# Patient Record
Sex: Female | Born: 1980 | Race: Asian | Hispanic: No | Marital: Married | Smoking: Never smoker
Health system: Southern US, Community
[De-identification: ages and names within clinical notes are randomized; demographics above are authoritative.]

## PROBLEM LIST (undated history)

## (undated) DIAGNOSIS — D3911 Neoplasm of uncertain behavior of right ovary: Secondary | ICD-10-CM

## (undated) HISTORY — PX: LASIK: SHX215

## (undated) HISTORY — DX: Neoplasm of uncertain behavior of right ovary: D39.11

---

## 2007-10-22 HISTORY — PX: SALPINGOOPHORECTOMY: SHX82

## 2008-03-10 ENCOUNTER — Other Ambulatory Visit: Admission: RE | Admit: 2008-03-10 | Discharge: 2008-03-10 | Payer: Self-pay | Admitting: Gynecology

## 2008-03-23 ENCOUNTER — Ambulatory Visit (HOSPITAL_COMMUNITY): Admission: RE | Admit: 2008-03-23 | Discharge: 2008-03-23 | Payer: Self-pay | Admitting: Gynecology

## 2008-04-14 ENCOUNTER — Ambulatory Visit (HOSPITAL_COMMUNITY): Admission: RE | Admit: 2008-04-14 | Discharge: 2008-04-15 | Payer: Self-pay | Admitting: Obstetrics and Gynecology

## 2008-04-14 ENCOUNTER — Encounter (INDEPENDENT_AMBULATORY_CARE_PROVIDER_SITE_OTHER): Payer: Self-pay | Admitting: Obstetrics and Gynecology

## 2008-12-08 ENCOUNTER — Ambulatory Visit (HOSPITAL_COMMUNITY): Admission: RE | Admit: 2008-12-08 | Discharge: 2008-12-08 | Payer: Self-pay | Admitting: Gynecology

## 2009-01-24 ENCOUNTER — Ambulatory Visit (HOSPITAL_COMMUNITY): Admission: RE | Admit: 2009-01-24 | Discharge: 2009-01-24 | Payer: Self-pay | Admitting: Obstetrics and Gynecology

## 2009-01-24 ENCOUNTER — Encounter (INDEPENDENT_AMBULATORY_CARE_PROVIDER_SITE_OTHER): Payer: Self-pay | Admitting: Obstetrics and Gynecology

## 2011-01-30 LAB — URINALYSIS, ROUTINE W REFLEX MICROSCOPIC
Glucose, UA: NEGATIVE mg/dL
Ketones, ur: NEGATIVE mg/dL
Leukocytes, UA: NEGATIVE
Protein, ur: NEGATIVE mg/dL
Urobilinogen, UA: 0.2 mg/dL (ref 0.0–1.0)

## 2011-01-30 LAB — PREGNANCY, URINE: Preg Test, Ur: NEGATIVE

## 2011-01-30 LAB — CBC
Hemoglobin: 12.7 g/dL (ref 12.0–15.0)
MCHC: 32.2 g/dL (ref 30.0–36.0)
Platelets: 256 10*3/uL (ref 150–400)
RDW: 13 % (ref 11.5–15.5)

## 2011-03-05 NOTE — Op Note (Signed)
NAME:  Breanna Jenkins, Breanna Jenkins NO.:  [id number]   MEDICAL RECORD NO.:  [id number]          PATIENT TYPE:  AMB   LOCATION:  SDC                           FACILITY:  [facility]   PHYSICIAN:  Miguel Aschoff, M.D.       DATE OF BIRTH:  December 12, 1980   DATE OF PROCEDURE:  01/24/2009  DATE OF DISCHARGE:                               OPERATIVE REPORT   PREOPERATIVE DIAGNOSIS:  Endometrial polyp.   POSTOPERATIVE DIAGNOSIS:  Endometrial polyp.   PROCEDURES:  1. Hysteroscopy.  2. Endometrial polypectomy.   SURGEON:  Miguel Aschoff, M.D.   ANESTHESIA:  General.   COMPLICATIONS:  None.   JUSTIFICATION:  The patient is a 30 year old Asian female followed by  Dr. Teodora Medici who on sonohysterogram was noted to have an  irregularity in the uterine cavity consistent with endometrial polyp.  Because of this finding, she presented now to undergo hysteroscopy and  polypectomy.  The risks and benefits of procedure were discussed with  the patient and her husband.  Informed consent has been obtained.   DESCRIPTION OF PROCEDURE:  The patient was taken to the operating room,  placed in supine position.  General anesthesia was administered without  difficulty.  She then placed in the dorsal lithotomy position, prepped  and draped in usual sterile fashion.  Bladder was catheterized.  Examination under anesthesia revealed normal external genitalia normal  Bartholin, Skene glands, normal urethra.  The vaginal vault was without  gross lesion.  The cervix was without gross lesion.  No adnexal masses  were noted.  At this point, speculum was placed in the vaginal vault.  Anterior cervical lip was grasped with a tenaculum and then the  endocervical canal was dilated using serial Pratt dilators until a #25  Pratt dilator could be passed.  Once this was done, diagnostic  hysteroscope was advanced through the endometrial canal.  No  endocervical lesions were noted.  On entering the endometrial cavity,  however,  there was polyp noted that appeared to be arising from the  fundus.  No other abnormalities were noted within the cavity.  At this  point, the hysteroscope was removed.  Polyp forceps were introduced.  The polyp was grasped and removed without difficulty, it was sent for  histologic study.  The hysteroscope was then were reintroduced to be  certain that the pathology had been eradicated and on examination at  this point, cavity was normal.  No other issues were noted.  At this  point, the procedure was completed.  The hysteroscope was removed.  The  tenaculum was taken off the cervix.  There was excellent hemostasis and  at this point the patient was taken out of lithotomy position, the  anesthetic reversed and brought to recovery room in satisfactory  condition.  Estimated blood loss was minimal.   Plan is for the patient to be discharged home.   MEDICATIONS:  For home include doxycycline one twice a day x3 days,  Darvocet-N 100 one every 4 hours as needed for pain.  She was instructed  to place nothing in the vagina for  10 days.  Call for any problems such  as fever, pain, or heavy bleeding.  She will be seen back in 4 weeks for  followup examination.      Miguel Aschoff, M.D.  Electronically Signed     AR/MEDQ  D:  01/24/2009  T:  01/25/2009  Job:  [id number]   cc:   Leatha Gilding. Mezer, M.D.  Fax: [phone]

## 2011-03-05 NOTE — Op Note (Signed)
NAME:  Breanna Jenkins, Breanna Jenkins NO.:  [id number]   MEDICAL RECORD NO.:  [id number]          PATIENT TYPE:  OIB   LOCATION:  9310                          [facility]ACILITY:  [facility]   PHYSICIAN:  Miguel Aschoff, M.D.       DATE OF BIRTH:  05-24-1981   DATE OF PROCEDURE:  04/14/2008  DATE OF DISCHARGE:                               OPERATIVE REPORT   PREOPERATIVE DIAGNOSIS:  A 12-cm left adnexal mass.   POSTOPERATIVE DIAGNOSIS:  A 12-cm left adnexal mass.   PROCEDURE:  Diagnostic laparoscopy with left salpingo-oophorectomy.   SURGEON:  Miguel Aschoff, MD and Luvenia Redden, MD   ANESTHESIA:  General.   COMPLICATIONS:  None.   JUSTIFICATION:  The patient is a 30 year old [place] female followed by  Dr. Chevis Pretty who was found to have an 11-cm cystic pelvic mass on  evaluation for routine examination.  The mass appeared to be  predominantly cystic.  A CA-125 level had been obtained and was 17.5  which was within normal range.  Because of this large mass with it being  probable neoplasm, she was offered surgery to correct this problem via  laparoscopy with possible laparotomy.  The risks and benefits of the  procedure were discussed with the patient.  Informed consent was  obtained.   PROCEDURE:  The patient was taken to the operating and placed in supine  position.  General anesthesia was administered without difficulty.  She  was then placed in the dorsal lithotomy position and prepped and draped  in the usual sterile fashion.  A Foley catheter was inserted prior to  this examination under anesthesia revealed normal external genitalia,  normal Bartholin Skene's glands, normal urethra.  The vaginal vault was  without gross lesion.  Cervix was without gross lesion.  Uterus noted to  be anterior appeared to be normal size, but above the uterus filling the  pelvis and extending midway up to the umbilicus with a large mobile mass  approximately 12 cm in size.  After the examination was carried  out,  attention was directed to the umbilicus where a small infraumbilical  incision was made.  A Veress needle was inserted and the abdomen was  insufflated with 3 liters CO2.  Following the insufflation, the trocar  to laparoscope was placed followed by laparoscope itself.  On  visualization, the cystic mass was obvious.  The external surface was  completely smooth.  No external excrescences were noted.  There was no  abnormal fluid noted in the abdominal cavity or pelvis and no palpable  lesions were noted on any pelvic structure or intestinal structure.  The  right tube and ovary appeared to be totally within normal limits.  The  uterus was small and anterior to the mass completely involving the left  ovary and it was not possible to discern and any normal ovarian tissue  apart from the cystic mass.  This had a benign appearance.  There were  some question on ultrasound that this represented a dermoid; however, it  was felt that this could be treated laparoscopically.  At this  point, a  5-mm port was established in the midline and the trocar was placed  directly into the cystic mass followed by placement of the Nezhat  suction irrigating unit and the mass was completely depressed using the  Nezhat suction irrigator.  Now that the mass reduced in size it was felt  that this could be treated laparoscopically.  An 11-mm port was then  established in the left lower quadrant.  The cystic ovarian mass was  then elevated.  The infundibulopelvic ligament found as was the ureter  and then with care to avoid any injury to adjacent structures the  infundibulopelvic ligament was cauterized and cut and then the  dissection continued along these ovarian ligament and mesosalpinx until  the uterus was reached.  At this point, the residual structures holding  the tube and ovary in place were cauterized, cut freeing the tube and  ovary.  The ovary was then placed in a EndoCatch bag and brought out   through the 11-mm port with mass now decompressed, tube was completely  contained within the EndoCatch bag and then brought out of the abdomen  in pieces in an effort to save this patient a laparotomy.  This tissue  was all sent for histologic study.  Prior to closing, the pelvis was  irrigated with copious amounts of saline.  Hemostasis appeared to be  excellent.  Once the EndoCatch unit was removed, the fascia and the left  lower quadrant was closed using a figure-of-eight suture of 2-0 Vicryl.  The other incisions were closed using subcuticular 4-0 Vicryl.  The port  sites were then injected with 0.25% Marcaine.  The patient reversed from  the anesthetic, taken to recovery room in satisfactory condition.  The  estimated blood loss was minimal.   PLAN:  The patient to be observed overnight and should be sent home on  April 15, 2008, in stable and satisfactory condition.  Tissue was sent  for histologic study.   FINAL IMPRESSION:  This represented a dermoid, but the final pathology  is pending.      Miguel Aschoff, M.D.  Electronically Signed     AR/MEDQ  D:  04/14/2008  T:  04/15/2008  Job:  [id number]   cc:   Leatha Gilding. Mezer, M.D.  Fax: [phone]

## 2011-03-08 NOTE — Discharge Summary (Signed)
NAME:  Breanna Jenkins, Breanna Jenkins NO.:  [id number]   MEDICAL RECORD NO.:  [id number]          PATIENT TYPE:  OIB   LOCATION:  9310                          [facility]   PHYSICIAN:  Miguel Aschoff, M.D.       DATE OF BIRTH:  [birth year]   DATE OF ADMISSION:  04/14/2008  DATE OF DISCHARGE:  04/15/2008                               DISCHARGE SUMMARY   ADMISSION DIAGNOSIS:  Large pelvic mass.   FINAL DIAGNOSIS:  Benign cystic teratoma, mucinous cyst adenoma of left  ovary.   BRIEF HISTORY:  The patient is a 30 year old oriental female seen by Dr.  Chevis Pretty and noted at that time to have a large pelvic mass.  On  examination on March 29, 2008, she was noted to have a 11 cm mostly cystic  mass involving what appeared to be left ovary extending up to the  midline between the symphysis pubis and umbilicus.  This mass was  nontender and due to big size and suspicion for being ovarian neoplasm,  the options of treatment were discussed with the patient including  laparoscopy and possible laparotomy.  After informed consent was  obtained and preoperative studies were obtained, the patient was taken  to the operating room on April 14, 2008 where under general anesthesia  laparoscopy was carried out.  At the time of laparoscopy, she was found  to have a large, smooth 11-12 cm mass involving the left ovary.  The  total ovary was involved and normal ovarian tissue could be identified.  This mass was reduced laparoscopically and a left salpingo-oophorectomy  was carried out without difficulty via the laparoscope.  The patient  tolerated the procedure well and was evaluated for overnight  observation.  By the next morning, she was in satisfactory condition and  will able to be discharged home.  Medications for home included Tylox 1-  2 every 3 hours as needed for pain.  She was instructed no heavy  lifting.  She is to call for any problems such as fever, pain or heavy  bleeding, place nothing in  vagina for 2 weeks.  The final pathology  report involving the left tube and ovary revealed ovarian endometriosis,  a benign cystic teratoma, and a mucinous cyst adenoma.  It should be  noted that the patient's right tube and ovary and other pelvic  structures were completely within normal limits and no other abnormal  intra-abdominal findings were found.   PLAN:  The patient will be seen back in 4 weeks for followup  examination.      Miguel Aschoff, M.D.  Electronically Signed     AR/MEDQ  D:  04/21/2008  T:  04/21/2008  Job:  [id number]

## 2011-07-18 LAB — BASIC METABOLIC PANEL
CO2: 27
Chloride: 103
Creatinine, Ser: 0.54
GFR calc Af Amer: >60
Sodium: 136

## 2011-07-18 LAB — CBC
Hemoglobin: 10.6 — ABNORMAL LOW
Hemoglobin: 12.3
MCHC: 33.5
MCV: 81.9
RBC: 3.81 — ABNORMAL LOW
RBC: 4.49

## 2011-07-18 LAB — HEMOGLOBIN AND HEMATOCRIT, BLOOD
HCT: 31.7 — ABNORMAL LOW
Hemoglobin: 10.7 — ABNORMAL LOW

## 2011-08-28 ENCOUNTER — Encounter (HOSPITAL_COMMUNITY): Payer: Self-pay | Admitting: *Deleted

## 2011-08-28 ENCOUNTER — Inpatient Hospital Stay (HOSPITAL_COMMUNITY)
Admission: AD | Admit: 2011-08-28 | Discharge: 2011-09-01 | DRG: 766 | Disposition: A | Discharge status: Home / Self Care | Payer: Managed Care, Other (non HMO) | Source: Ambulatory Visit | Attending: Obstetrics and Gynecology | Admitting: Obstetrics and Gynecology

## 2011-08-28 DIAGNOSIS — O324XX0 Maternal care for high head at term, not applicable or unspecified: Secondary | ICD-10-CM | POA: Diagnosis present

## 2011-08-28 DIAGNOSIS — O480 Post-term pregnancy: Principal | ICD-10-CM | POA: Diagnosis present

## 2011-08-28 NOTE — Progress Notes (Signed)
Dr. Marcelle Overlie called on cell phone.  No answer.  Left voicemail to return call to [facility].

## 2011-08-28 NOTE — Progress Notes (Signed)
Dr. Marcelle Overlie notified of pt presenting for induction. Notified of birthing suites unaware.  Notified of VE, ctx pattern and fetal HR tracing.  MD will put orders in for pt.

## 2011-08-28 NOTE — Progress Notes (Signed)
Called Dr. Marcelle Overlie to request DC efm and diet for pt. While waiting on room.  Left message on voicemail.

## 2011-08-28 NOTE — Progress Notes (Signed)
Pt presents for induction, states she is being induced for postdates. occassional uc's. G1

## 2011-08-28 NOTE — Progress Notes (Signed)
Informed pt of waiting on bed at this time.  Pt denies any needs.

## 2011-08-28 NOTE — Progress Notes (Signed)
efm dc'd at this time for reactive fetal tracing.

## 2011-08-29 ENCOUNTER — Encounter (HOSPITAL_COMMUNITY): Payer: Self-pay | Admitting: Anesthesiology

## 2011-08-29 ENCOUNTER — Inpatient Hospital Stay (HOSPITAL_COMMUNITY): Payer: Managed Care, Other (non HMO) | Admitting: Anesthesiology

## 2011-08-29 ENCOUNTER — Encounter (HOSPITAL_COMMUNITY): Payer: Self-pay | Admitting: *Deleted

## 2011-08-29 ENCOUNTER — Encounter (HOSPITAL_COMMUNITY)
Admission: AD | Disposition: A | Discharge status: Home / Self Care | Payer: Self-pay | Source: Ambulatory Visit | Attending: Obstetrics and Gynecology

## 2011-08-29 LAB — HEPATITIS B SURFACE ANTIGEN: Hepatitis B Surface Ag: NEGATIVE

## 2011-08-29 LAB — ABO/RH: RH Type: POSITIVE

## 2011-08-29 LAB — RUBELLA ANTIBODY, IGM: Rubella: IMMUNE

## 2011-08-29 LAB — CBC
MCH: 26.9 pg (ref 26.0–34.0)
MCHC: 33.3 g/dL (ref 30.0–36.0)
MCV: 80.7 fL (ref 78.0–100.0)
Platelets: 205 10*3/uL (ref 150–400)
RDW: 16.8 % — ABNORMAL HIGH (ref 11.5–15.5)

## 2011-08-29 LAB — RPR: RPR: NONREACTIVE

## 2011-08-29 LAB — STREP B DNA PROBE: GBS: NEGATIVE

## 2011-08-29 SURGERY — Surgical Case
Anesthesia: Regional

## 2011-08-29 MED ORDER — LACTATED RINGERS IV SOLN
INTRAVENOUS | Status: DC
  Administered 2011-08-30: 02:00:00 via INTRAVENOUS

## 2011-08-29 MED ORDER — LACTATED RINGERS IV SOLN: 500.0000 mL | Freq: Once | INTRAVENOUS | Status: DC

## 2011-08-29 MED ORDER — SODIUM BICARBONATE 8.4 % IV SOLN
INTRAVENOUS | Status: AC
  Filled 2011-08-29: qty 50

## 2011-08-29 MED ORDER — TERBUTALINE SULFATE 1 MG/ML IJ SOLN: 0.2500 mg | Freq: Once | INTRAMUSCULAR | Status: DC | PRN

## 2011-08-29 MED ORDER — FENTANYL 2.5 MCG/ML BUPIVACAINE 1/10 % EPIDURAL INFUSION (WH - ANES)
INTRAMUSCULAR | Status: DC | PRN
  Administered 2011-08-29: 12 mL/h via EPIDURAL

## 2011-08-29 MED ORDER — MEASLES, MUMPS & RUBELLA VAC ~~LOC~~ INJ: 0.5000 mL | INJECTION | Freq: Once | SUBCUTANEOUS | Status: DC

## 2011-08-29 MED ORDER — CEFAZOLIN SODIUM 1-5 GM-% IV SOLN
INTRAVENOUS | Status: AC
  Filled 2011-08-29: qty 50

## 2011-08-29 MED ORDER — ONDANSETRON HCL 4 MG/2ML IJ SOLN: 4.0000 mg | Freq: Four times a day (QID) | INTRAMUSCULAR | Status: DC | PRN

## 2011-08-29 MED ORDER — LIDOCAINE-EPINEPHRINE (PF) 2 %-1:200000 IJ SOLN
INTRAMUSCULAR | Status: AC
  Filled 2011-08-29: qty 20

## 2011-08-29 MED ORDER — DIPHENHYDRAMINE HCL 50 MG/ML IJ SOLN: 12.5000 mg | INTRAMUSCULAR | Status: DC | PRN

## 2011-08-29 MED ORDER — MEPERIDINE HCL 25 MG/ML IJ SOLN: 6.2500 mg | INTRAMUSCULAR | Status: DC | PRN

## 2011-08-29 MED ORDER — MORPHINE SULFATE (PF) 0.5 MG/ML IJ SOLN
INTRAMUSCULAR | Status: DC | PRN
Start: 2011-08-29 — End: 2011-08-29
  Administered 2011-08-29: 3 mg via EPIDURAL

## 2011-08-29 MED ORDER — EPHEDRINE 5 MG/ML INJ
10.0000 mg | INTRAVENOUS | Status: DC | PRN
  Filled 2011-08-29: qty 4

## 2011-08-29 MED ORDER — FENTANYL CITRATE 0.05 MG/ML IJ SOLN: 25.0000 ug | INTRAMUSCULAR | Status: DC | PRN

## 2011-08-29 MED ORDER — CEFAZOLIN SODIUM 1-5 GM-% IV SOLN: 1.0000 g | INTRAVENOUS | Status: DC

## 2011-08-29 MED ORDER — ACETAMINOPHEN 325 MG PO TABS: 650.0000 mg | ORAL_TABLET | ORAL | Status: DC | PRN

## 2011-08-29 MED ORDER — FLEET ENEMA 7-19 GM/118ML RE ENEM: 1.0000 | ENEMA | RECTAL | Status: DC | PRN

## 2011-08-29 MED ORDER — MORPHINE SULFATE 0.5 MG/ML IJ SOLN
INTRAMUSCULAR | Status: AC
  Filled 2011-08-29: qty 10

## 2011-08-29 MED ORDER — ACETAMINOPHEN 10 MG/ML IV SOLN: 1000.0000 mg | Freq: Four times a day (QID) | INTRAVENOUS | Status: AC | PRN

## 2011-08-29 MED ORDER — OXYTOCIN 20 UNITS IN LACTATED RINGERS INFUSION - SIMPLE
INTRAVENOUS | Status: AC
  Administered 2011-08-29: 125 mL/h via INTRAVENOUS
  Filled 2011-08-29: qty 1000

## 2011-08-29 MED ORDER — PHENYLEPHRINE 40 MCG/ML (10ML) SYRINGE FOR IV PUSH (FOR BLOOD PRESSURE SUPPORT): 80.0000 ug | PREFILLED_SYRINGE | INTRAVENOUS | Status: DC | PRN

## 2011-08-29 MED ORDER — DIPHENHYDRAMINE HCL 50 MG/ML IJ SOLN: 25.0000 mg | INTRAMUSCULAR | Status: DC | PRN

## 2011-08-29 MED ORDER — KETOROLAC TROMETHAMINE 60 MG/2ML IM SOLN
INTRAMUSCULAR | Status: AC
  Administered 2011-08-29: 60 mg via INTRAMUSCULAR
  Filled 2011-08-29: qty 2

## 2011-08-29 MED ORDER — METOCLOPRAMIDE HCL 5 MG/ML IJ SOLN: 10.0000 mg | Freq: Three times a day (TID) | INTRAMUSCULAR | Status: DC | PRN

## 2011-08-29 MED ORDER — NALBUPHINE HCL 10 MG/ML IJ SOLN: 5.0000 mg | INTRAMUSCULAR | Status: DC | PRN

## 2011-08-29 MED ORDER — EPHEDRINE 5 MG/ML INJ: 10.0000 mg | INTRAVENOUS | Status: DC | PRN

## 2011-08-29 MED ORDER — ONDANSETRON HCL 4 MG/2ML IJ SOLN
INTRAMUSCULAR | Status: AC
  Filled 2011-08-29: qty 2

## 2011-08-29 MED ORDER — OXYCODONE-ACETAMINOPHEN 5-325 MG PO TABS: 2.0000 | ORAL_TABLET | ORAL | Status: DC | PRN

## 2011-08-29 MED ORDER — ZOLPIDEM TARTRATE 10 MG PO TABS: 10.0000 mg | ORAL_TABLET | Freq: Every evening | ORAL | Status: DC | PRN

## 2011-08-29 MED ORDER — LACTATED RINGERS IV SOLN
INTRAVENOUS | Status: DC
  Administered 2011-08-29: 17:00:00 via INTRAVENOUS

## 2011-08-29 MED ORDER — KETOROLAC TROMETHAMINE 60 MG/2ML IM SOLN
60.0000 mg | Freq: Once | INTRAMUSCULAR | Status: AC | PRN
  Administered 2011-08-29: 60 mg via INTRAMUSCULAR

## 2011-08-29 MED ORDER — PHENYLEPHRINE 40 MCG/ML (10ML) SYRINGE FOR IV PUSH (FOR BLOOD PRESSURE SUPPORT)
80.0000 ug | PREFILLED_SYRINGE | INTRAVENOUS | Status: DC | PRN
  Filled 2011-08-29 (×3): qty 5

## 2011-08-29 MED ORDER — OXYTOCIN 20 UNITS IN LACTATED RINGERS INFUSION - SIMPLE
1.0000 m[IU]/min | INTRAVENOUS | Status: DC
  Administered 2011-08-29: 1 m[IU]/min via INTRAVENOUS
  Filled 2011-08-29: qty 1000

## 2011-08-29 MED ORDER — OXYTOCIN 20 UNITS IN LACTATED RINGERS INFUSION - SIMPLE
125.0000 mL/h | INTRAVENOUS | Status: AC
  Administered 2011-08-29 (×2): 125 mL/h via INTRAVENOUS
  Filled 2011-08-29: qty 1000

## 2011-08-29 MED ORDER — ONDANSETRON HCL 4 MG/2ML IJ SOLN: 4.0000 mg | Freq: Three times a day (TID) | INTRAMUSCULAR | Status: DC | PRN

## 2011-08-29 MED ORDER — SENNOSIDES-DOCUSATE SODIUM 8.6-50 MG PO TABS
2.0000 | ORAL_TABLET | Freq: Every day | ORAL | Status: DC
  Administered 2011-08-30 – 2011-08-31 (×2): 2 via ORAL

## 2011-08-29 MED ORDER — SIMETHICONE 80 MG PO CHEW
80.0000 mg | CHEWABLE_TABLET | Freq: Three times a day (TID) | ORAL | Status: DC
  Administered 2011-08-30 – 2011-09-01 (×7): 80 mg via ORAL

## 2011-08-29 MED ORDER — OXYTOCIN BOLUS FROM INFUSION
500.0000 mL | Freq: Once | INTRAVENOUS | Status: DC
  Filled 2011-08-29: qty 500

## 2011-08-29 MED ORDER — NALOXONE HCL 0.4 MG/ML IJ SOLN: 1.0000 ug/kg/h | INTRAMUSCULAR | Status: DC | PRN

## 2011-08-29 MED ORDER — ONDANSETRON HCL 4 MG/2ML IJ SOLN
4.0000 mg | INTRAMUSCULAR | Status: DC | PRN
  Administered 2011-08-29: 4 mg via INTRAVENOUS
  Filled 2011-08-29: qty 2

## 2011-08-29 MED ORDER — OXYTOCIN 10 UNIT/ML IJ SOLN
INTRAMUSCULAR | Status: AC
  Filled 2011-08-29: qty 2

## 2011-08-29 MED ORDER — SCOPOLAMINE 1 MG/3DAYS TD PT72
1.0000 | MEDICATED_PATCH | Freq: Once | TRANSDERMAL | Status: DC
  Administered 2011-08-29: 1.5 mg via TRANSDERMAL

## 2011-08-29 MED ORDER — LACTATED RINGERS IV SOLN
500.0000 mL | INTRAVENOUS | Status: DC | PRN
  Administered 2011-08-29: 500 mL via INTRAVENOUS

## 2011-08-29 MED ORDER — IBUPROFEN 600 MG PO TABS: 600.0000 mg | ORAL_TABLET | Freq: Four times a day (QID) | ORAL | Status: DC | PRN

## 2011-08-29 MED ORDER — ONDANSETRON HCL 4 MG/2ML IJ SOLN
INTRAMUSCULAR | Status: DC | PRN
  Administered 2011-08-29: 4 mg via INTRAVENOUS

## 2011-08-29 MED ORDER — KETOROLAC TROMETHAMINE 30 MG/ML IJ SOLN: 30.0000 mg | Freq: Four times a day (QID) | INTRAMUSCULAR | Status: DC | PRN

## 2011-08-29 MED ORDER — TETANUS-DIPHTH-ACELL PERTUSSIS 5-2.5-18.5 LF-MCG/0.5 IM SUSP: 0.5000 mL | Freq: Once | INTRAMUSCULAR | Status: DC

## 2011-08-29 MED ORDER — SODIUM BICARBONATE 8.4 % IV SOLN
INTRAVENOUS | Status: DC | PRN
  Administered 2011-08-29: 4 mL via EPIDURAL

## 2011-08-29 MED ORDER — IBUPROFEN 600 MG PO TABS
600.0000 mg | ORAL_TABLET | Freq: Four times a day (QID) | ORAL | Status: DC
  Administered 2011-08-30 – 2011-09-01 (×10): 600 mg via ORAL
  Filled 2011-08-29 (×9): qty 1

## 2011-08-29 MED ORDER — LIDOCAINE HCL (PF) 1 % IJ SOLN: 30.0000 mL | INTRAMUSCULAR | Status: DC | PRN

## 2011-08-29 MED ORDER — OXYCODONE-ACETAMINOPHEN 5-325 MG PO TABS
1.0000 | ORAL_TABLET | ORAL | Status: DC | PRN
  Administered 2011-08-30 – 2011-08-31 (×3): 1 via ORAL
  Filled 2011-08-29 (×3): qty 1

## 2011-08-29 MED ORDER — PRENATAL PLUS 27-1 MG PO TABS
1.0000 | ORAL_TABLET | Freq: Every day | ORAL | Status: DC
  Administered 2011-08-30 – 2011-09-01 (×3): 1 via ORAL
  Filled 2011-08-29 (×2): qty 1

## 2011-08-29 MED ORDER — SODIUM CHLORIDE 0.9 % IJ SOLN: 3.0000 mL | INTRAMUSCULAR | Status: DC | PRN

## 2011-08-29 MED ORDER — SCOPOLAMINE 1 MG/3DAYS TD PT72: 1.0000 | MEDICATED_PATCH | Freq: Once | TRANSDERMAL | Status: DC

## 2011-08-29 MED ORDER — DIPHENHYDRAMINE HCL 25 MG PO CAPS: 25.0000 mg | ORAL_CAPSULE | ORAL | Status: DC | PRN

## 2011-08-29 MED ORDER — OXYTOCIN 20 UNITS IN LACTATED RINGERS INFUSION - SIMPLE
INTRAVENOUS | Status: DC | PRN
  Administered 2011-08-29: 20 [IU] via INTRAVENOUS

## 2011-08-29 MED ORDER — SODIUM CHLORIDE 0.9 % IV SOLN: 1.0000 ug/kg/h | INTRAVENOUS | Status: DC | PRN

## 2011-08-29 MED ORDER — ZOLPIDEM TARTRATE 5 MG PO TABS: 5.0000 mg | ORAL_TABLET | Freq: Every evening | ORAL | Status: DC | PRN

## 2011-08-29 MED ORDER — MEDROXYPROGESTERONE ACETATE 150 MG/ML IM SUSP: 150.0000 mg | INTRAMUSCULAR | Status: DC | PRN

## 2011-08-29 MED ORDER — SIMETHICONE 80 MG PO CHEW: 80.0000 mg | CHEWABLE_TABLET | ORAL | Status: DC | PRN

## 2011-08-29 MED ORDER — DIPHENHYDRAMINE HCL 50 MG/ML IJ SOLN
12.5000 mg | INTRAMUSCULAR | Status: DC | PRN
  Administered 2011-08-30: 12.5 mg via INTRAVENOUS
  Filled 2011-08-29: qty 1

## 2011-08-29 MED ORDER — FENTANYL 2.5 MCG/ML BUPIVACAINE 1/10 % EPIDURAL INFUSION (WH - ANES)
14.0000 mL/h | INTRAMUSCULAR | Status: DC
  Administered 2011-08-29: 14 mL/h via EPIDURAL
  Filled 2011-08-29 (×2): qty 60

## 2011-08-29 MED ORDER — CEFAZOLIN SODIUM 1-5 GM-% IV SOLN
INTRAVENOUS | Status: DC | PRN
  Administered 2011-08-29: 1 g via INTRAVENOUS

## 2011-08-29 MED ORDER — NALOXONE HCL 0.4 MG/ML IJ SOLN: 0.4000 mg | INTRAMUSCULAR | Status: DC | PRN

## 2011-08-29 MED ORDER — KETOROLAC TROMETHAMINE 30 MG/ML IJ SOLN: 30.0000 mg | Freq: Four times a day (QID) | INTRAMUSCULAR | Status: AC | PRN

## 2011-08-29 MED ORDER — WITCH HAZEL-GLYCERIN EX PADS: 1.0000 "application " | MEDICATED_PAD | CUTANEOUS | Status: DC | PRN

## 2011-08-29 MED ORDER — LACTATED RINGERS IV SOLN
INTRAVENOUS | Status: DC
  Administered 2011-08-29 (×2): via INTRAVENOUS

## 2011-08-29 MED ORDER — ONDANSETRON HCL 4 MG PO TABS: 4.0000 mg | ORAL_TABLET | ORAL | Status: DC | PRN

## 2011-08-29 MED ORDER — DIPHENHYDRAMINE HCL 25 MG PO CAPS
25.0000 mg | ORAL_CAPSULE | ORAL | Status: DC | PRN
  Filled 2011-08-29: qty 1

## 2011-08-29 MED ORDER — OXYTOCIN 20 UNITS IN LACTATED RINGERS INFUSION - SIMPLE: 125.0000 mL/h | Freq: Once | INTRAVENOUS | Status: DC

## 2011-08-29 MED ORDER — SCOPOLAMINE 1 MG/3DAYS TD PT72
MEDICATED_PATCH | TRANSDERMAL | Status: AC
  Administered 2011-08-29: 1.5 mg via TRANSDERMAL
  Filled 2011-08-29: qty 1

## 2011-08-29 MED ORDER — BISACODYL 10 MG RE SUPP: 10.0000 mg | Freq: Every day | RECTAL | Status: DC | PRN

## 2011-08-29 MED ORDER — BUPIVACAINE HCL (PF) 0.25 % IJ SOLN: INTRAMUSCULAR | Status: DC | PRN

## 2011-08-29 MED ORDER — LANOLIN HYDROUS EX OINT: 1.0000 "application " | TOPICAL_OINTMENT | CUTANEOUS | Status: DC | PRN

## 2011-08-29 MED ORDER — DIBUCAINE 1 % RE OINT: 1.0000 "application " | TOPICAL_OINTMENT | RECTAL | Status: DC | PRN

## 2011-08-29 MED ORDER — DIPHENHYDRAMINE HCL 25 MG PO CAPS
25.0000 mg | ORAL_CAPSULE | Freq: Four times a day (QID) | ORAL | Status: DC | PRN
  Filled 2011-08-29: qty 1

## 2011-08-29 MED ORDER — CITRIC ACID-SODIUM CITRATE 334-500 MG/5ML PO SOLN
30.0000 mL | ORAL | Status: DC | PRN
  Administered 2011-08-29: 30 mL via ORAL
  Filled 2011-08-29: qty 15

## 2011-08-29 MED ORDER — MENTHOL 3 MG MT LOZG: 1.0000 | LOZENGE | OROMUCOSAL | Status: DC | PRN

## 2011-08-29 MED ORDER — FENTANYL CITRATE 0.05 MG/ML IJ SOLN
INTRAMUSCULAR | Status: AC
  Filled 2011-08-29: qty 2

## 2011-08-29 MED ORDER — IBUPROFEN 600 MG PO TABS
600.0000 mg | ORAL_TABLET | Freq: Four times a day (QID) | ORAL | Status: DC | PRN
  Filled 2011-08-29: qty 1

## 2011-08-29 SURGICAL SUPPLY — 26 items
BARRIER ADHS 3X4 INTERCEED (GAUZE/BANDAGES/DRESSINGS) IMPLANT
CHLORAPREP W/TINT 26ML (MISCELLANEOUS) ×2 IMPLANT
CLOTH BEACON ORANGE TIMEOUT ST (SAFETY) ×2 IMPLANT
CONTAINER PREFILL 10% NBF 15ML (MISCELLANEOUS) IMPLANT
ELECT REM PT RETURN 9FT ADLT (ELECTROSURGICAL) ×2
ELECTRODE REM PT RTRN 9FT ADLT (ELECTROSURGICAL) ×1 IMPLANT
EXTRACTOR VACUUM M CUP 4 TUBE (SUCTIONS) IMPLANT
GLOVE BIO SURGEON STRL SZ 6.5 (GLOVE) ×4 IMPLANT
GOWN PREVENTION PLUS LG XLONG (DISPOSABLE) ×6 IMPLANT
KIT ABG SYR 3ML LUER SLIP (SYRINGE) IMPLANT
NEEDLE HYPO 22GX1.5 SAFETY (NEEDLE) ×2 IMPLANT
NEEDLE HYPO 25X5/8 SAFETYGLIDE (NEEDLE) ×2 IMPLANT
NS IRRIG 1000ML POUR BTL (IV SOLUTION) ×2 IMPLANT
PACK C SECTION WH (CUSTOM PROCEDURE TRAY) ×2 IMPLANT
SLEEVE SCD COMPRESS KNEE MED (MISCELLANEOUS) IMPLANT
STAPLER VISISTAT 35W (STAPLE) IMPLANT
SUT CHROMIC 0 CTX 36 (SUTURE) ×4 IMPLANT
SUT PLAIN 0 NONE (SUTURE) IMPLANT
SUT PLAIN 2 0 XLH (SUTURE) IMPLANT
SUT VIC AB 0 CT1 27 (SUTURE) ×3
SUT VIC AB 0 CT1 27XBRD ANBCTR (SUTURE) ×3 IMPLANT
SUT VIC AB 4-0 KS 27 (SUTURE) IMPLANT
SYR CONTROL 10ML LL (SYRINGE) ×2 IMPLANT
TOWEL OR 17X24 6PK STRL BLUE (TOWEL DISPOSABLE) ×4 IMPLANT
TRAY FOLEY CATH 14FR (SET/KITS/TRAYS/PACK) ×2 IMPLANT
WATER STERILE IRR 1000ML POUR (IV SOLUTION) ×2 IMPLANT

## 2011-08-29 NOTE — Transfer of Care (Signed)
Immediate Anesthesia Transfer of Care Note  Patient: Breanna Jenkins  Procedure(s) Performed:  CESAREAN SECTION  Patient Location: PACU  Anesthesia Type: Epidural  Level of Consciousness: awake  Airway & Oxygen Therapy: Patient Spontanous Breathing  Post-op Assessment: Report given to PACU RN and Post -op Vital signs reviewed and stable  Post vital signs: Reviewed and stable  Complications: No apparent anesthesia complications

## 2011-08-29 NOTE — Op Note (Signed)
NAME:  Breanna Jenkins, Breanna Jenkins                ACCOUNT NO.:  [id number]  MEDICAL RECORD NO.:  [id number]  LOCATION:  9101                          FACILITY:  [facility]  PHYSICIAN:  Shanee Batch L. Providencia Hottenstein, M.D.DATE OF BIRTH:  09/22/1981  DATE OF PROCEDURE:  08/29/2011 DATE OF DISCHARGE:                              OPERATIVE REPORT   PREOPERATIVE DIAGNOSES: 1. Intrauterine pregnancy at term. 2. Failure to progress, arrest of descent.  POSTOPERATIVE DIAGNOSES: 1. Intrauterine pregnancy at term. 2. Failure to progress, arrest of descent.  PROCEDURE:  Primary low transverse cesarean section.  SURGEON:  Liona Wengert L. Helina Hullum, MD  ANESTHESIA:  An epidural.  EBL:  Less than 500.  DRAINS:  Foley.  PATHOLOGY:  None.  COMPLICATIONS:  None.  PROCEDURE IN DETAIL:  The patient was taken to the operating room from room 160.  She was prepped and draped after time-out was performed according to the ACOG guidelines.  A Foley catheter had been inserted while on labor and delivery.  A low transverse incision was made carried down the fascia.  Fascia scored in the midline, extended laterally. Rectus muscles were separated in the midline.  The peritoneum was entered bluntly.  The peritoneal incision was then stretched.  The bladder blade was inserted.  The lower uterine segment was identified and bladder flap was created sharply and then digitally.  The bladder blade was then readjusted.  A low transverse incision was made in the uterus.  The baby was in OP position, was large for gestational age, female infant, Apgars 9 at 1 minute and 9 at 5 minutes and was delivered easily.  The cord was clamped and cut.  The baby was handed to the awaiting neonatal team and taken to the Newborn Nursery.  The baby's weight was 9 pounds 3 ounces.  The placenta was manually removed, noted to be normal intact with a three-vessel cord.  Antibiotics and Pitocin were given.  The uterus was exteriorized.  It was cleared of all  clots and debris.  The uterus was firm.  The uterine incision was closed in 2 layers using 0 chromic in a running, locked stitch.  The uterus was returned to the abdomen.  Irrigation was performed.  Hemostasis was excellent.  The peritoneum was closed using 0 Vicryl.  The rectus muscles were reapproximated using 0 Vicryl.  The fascia closed using 0 Vicryl starting each corner and meeting in the midline.  After irrigation of subcutaneous layer, the skin was closed with staples.  All sponge, lap, and instrument counts were correct x2.  The patient went to recovery room stable condition.     Wing Gfeller L. Vincente Poli, M.D.     Florestine Avers  D:  08/29/2011  T:  08/29/2011  Job:  [id number]

## 2011-08-29 NOTE — Progress Notes (Signed)
Patient pushing 1 1/2 hour No progress Head at -1 station. Recommend Primary LTCS Risks discussed with patient Agree to proceed Have notified the OR

## 2011-08-29 NOTE — Addendum Note (Signed)
Addendum  created 08/29/11 2056 by Velna Hatchet, MD   Modules edited:Orders, PRL Based Order Sets

## 2011-08-29 NOTE — Progress Notes (Signed)
Pt may go to room 160 in .

## 2011-08-29 NOTE — Anesthesia Procedure Notes (Signed)

## 2011-08-29 NOTE — Anesthesia Postprocedure Evaluation (Signed)
  Anesthesia Post-op Note  Patient: Breanna Jenkins  Procedure(s) Performed:  CESAREAN SECTION   Patient is awake, responsive, moving her legs, and has signs of resolution of her numbness. Pain and nausea are reasonably well controlled. Vital signs are stable and clinically acceptable. Oxygen saturation is clinically acceptable. There are no apparent anesthetic complications at this time. Patient is ready for discharge.

## 2011-08-29 NOTE — Anesthesia Preprocedure Evaluation (Signed)

## 2011-08-29 NOTE — Progress Notes (Signed)
FHT's 135 via external doppler.  Pt states + fm.

## 2011-08-29 NOTE — Progress Notes (Signed)
Pt to room 160 at this time.

## 2011-08-29 NOTE — Progress Notes (Signed)
Pt denies needs at this time.  

## 2011-08-29 NOTE — H&P (Signed)
30 year old Gravida 1 Para 0 at 40 weeks and 3 days admitted last night for post dates induction. She has had uncomplicated prenatal care.  See [place] form GBBS is negative  Afebrile Vital signs are stable FHR is reactive Toco Contractions are every 2 minutes General alert and oriented Lung CTAB Car Regular rate and rhythm Leopolds 8 pound 8 pound 4 oz by ultrasound Cervix 90%/tight 4 cm / -1 vertex arom clear fluid  IMPRESSION: Post dates induction  Plan: Continue pitocin Epidural  Follow labor curve

## 2011-08-29 NOTE — Brief Op Note (Signed)
08/28/2011 - 08/29/2011  6:04 PM  PATIENT:  Gaylord Shih  30 y.o. female  PRE-OPERATIVE DIAGNOSIS:  Failure to Progress  POST-OPERATIVE DIAGNOSIS:  Failure to Progress  PROCEDURE:  Procedure(s): Primary Low Transverse CESAREAN SECTION  SURGEON:  Surgeon(s): Jeani Hawking, MD  PHYSICIAN ASSISTANT:   ASSISTANTS: none   ANESTHESIA:   epidural  EBL:  Total I/O In: 800 [I.V.:800] Out: 500 [Blood:500]  BLOOD ADMINISTERED:none  DRAINS: Urinary Catheter (Foley)   LOCAL MEDICATIONS USED:  NONE  SPECIMEN:  No Specimen  DISPOSITION OF SPECIMEN:  N/A  COUNTS:  YES  TOURNIQUET:  * No tourniquets in log *  DICTATION: .Other Dictation: Dictation Number [id number]  PLAN OF CARE: Admit to inpatient   PATIENT DISPOSITION:  PACU - hemodynamically stable.   Delay start of Pharmacological VTE agent (>24hrs) due to surgical blood loss or risk of bleeding:  {YES/NO/NOT APPLICABLE:20182

## 2011-08-30 LAB — CBC
HCT: 24.7 % — ABNORMAL LOW (ref 36.0–46.0)
Hemoglobin: 8.1 g/dL — ABNORMAL LOW (ref 12.0–15.0)
MCH: 27.2 pg (ref 26.0–34.0)
MCV: 82.9 fL (ref 78.0–100.0)
RBC: 2.98 MIL/uL — ABNORMAL LOW (ref 3.87–5.11)

## 2011-08-30 NOTE — Anesthesia Postprocedure Evaluation (Signed)
  Anesthesia Post-op Note  Patient: Breanna Jenkins  Procedure(s) Performed:  CESAREAN SECTION  Patient Location: Mother/Baby  Anesthesia Type: Epidural  Level of Consciousness: awake, alert  and oriented  Airway and Oxygen Therapy: Patient Spontanous Breathing  Post-op Pain: none  Post-op Assessment: Post-op Vital signs reviewed, Patient's Cardiovascular Status Stable, No headache, No backache, No residual numbness and No residual motor weakness  Post-op Vital Signs: Reviewed and stable  Complications: No apparent anesthesia complications

## 2011-08-30 NOTE — Addendum Note (Signed)
Addendum  created 08/30/11 0803 by Madison Hickman   Modules edited:Notes Section

## 2011-08-30 NOTE — Progress Notes (Signed)
Subjective: Postpartum Day 1: Cesarean Delivery Patient reports tolerating PO and + flatus.    Objective: Vital signs in last 24 hours: Temp:  [98.3 F (36.8 C)-99.3 F (37.4 C)] 98.9 F (37.2 C) (11/09 0530) Pulse Rate:  [29-118] 108  (11/09 0530) Resp:  [16-25] 18  (11/09 0132) BP: (87-144)/(39-69) 102/68 mmHg (11/09 0530) SpO2:  [82 %-99 %] 94 % (11/09 0530)  Physical Exam:  General: alert and cooperative Lochia: appropriate Uterine Fundus: firm abd dressing CDI DVT Evaluation: No evidence of DVT seen on physical exam.   Basename 08/30/11 0510 08/29/11 0218  HGB 8.1* 13.5  HCT 24.7* 40.5    Assessment/Plan: Status post Cesarean section. Doing well postoperatively.  Continue current care.  CURTIS,CAROL G 08/30/2011, 7:45 AM

## 2011-08-30 NOTE — Progress Notes (Signed)
Nursing Tech notified RN of B/P 70/34 HR 1059  (Repeat checks in both arms- see v/s in documentation. Patient asymptomatic, bleeding is scant to small.Uterus 1 below.  Patient Breastfeeding in room, and has been up to BR and walking in room. No change in pain. Dr. Rana Snare notified and orders given to push oral fluids, do not restart IV, and repeat CBC in the am of 112/10.Dr. Rana Snare aware of patients drop in hemoglobin.RN asked Dr. Rana Snare to check uterus. Dr. Rana Snare will follow up in am.

## 2011-08-31 LAB — DIFFERENTIAL
Eosinophils Absolute: 0.2 10*3/uL (ref 0.0–0.7)
Eosinophils Relative: 1 % (ref 0–5)
Lymphocytes Relative: 9 % — ABNORMAL LOW (ref 12–46)
Monocytes Absolute: 1.7 10*3/uL — ABNORMAL HIGH (ref 0.1–1.0)
Neutrophils Relative %: 80 % — ABNORMAL HIGH (ref 43–77)
WBC Morphology: INCREASED

## 2011-08-31 LAB — CBC
MCH: 27.4 pg (ref 26.0–34.0)
MCHC: 32.8 g/dL (ref 30.0–36.0)
Platelets: 154 10*3/uL (ref 150–400)
RBC: 3.03 MIL/uL — ABNORMAL LOW (ref 3.87–5.11)

## 2011-08-31 NOTE — Progress Notes (Signed)
Subjective: Postpartum Day 2: Cesarean Delivery Patient reports no problems voiding.    Objective: Vital signs in last 24 hours: Temp:  [98.1 F (36.7 C)-98.5 F (36.9 C)] 98.2 F (36.8 C) (11/10 0447) Pulse Rate:  [92-107] 92  (11/10 0447) Resp:  [17-18] 18  (11/10 0447) BP: (70-104)/(34-67) 86/53 mmHg (11/10 0447) SpO2:  [96 %-99 %] 96 % (11/09 2208)  Physical Exam:  General: alert, cooperative and no distress Lochia: appropriate Uterine Fundus: firm Incision: healing well DVT Evaluation: No evidence of DVT seen on physical exam.   Basename 08/31/11 0557 08/30/11 0510  HGB 8.3* 8.1*  HCT 25.3* 24.7*    Assessment/Plan: Status post Cesarean section. Doing well postoperatively.  Continue current care.  Thiago Ragsdale C 08/31/2011, 8:12 AM

## 2011-09-01 MED ORDER — OXYCODONE-ACETAMINOPHEN 5-500 MG PO CAPS: 1.0000 | ORAL_CAPSULE | ORAL | Status: AC | PRN

## 2011-09-01 NOTE — Progress Notes (Signed)
Subjective: Postpartum Day 3: Cesarean Delivery Patient reports tolerating PO.    Objective: Vital signs in last 24 hours: Temp:  [98.1 F (36.7 Jenkins)-98.6 F (37 Jenkins)] 98.6 F (37 Jenkins) (11/11 0500) Pulse Rate:  [87-97] 87  (11/11 0500) Resp:  [16-18] 18  (11/11 0500) BP: (86-106)/(53-66) 106/66 mmHg (11/11 0500)  Physical Exam:  General: alert, cooperative and no distress Lochia: appropriate Uterine Fundus: firm Incision: healing well DVT Evaluation: No evidence of DVT seen on physical exam.   Basename 08/31/11 0557 08/30/11 0510  HGB 8.3* 8.1*  HCT 25.3* 24.7*    Assessment/Plan: Status post Cesarean section. Doing well postoperatively.  Discharge home with standard precautions and return to clinic in 4-6 weeks Will dc staples and send home with Tylox and Feso4.  Breanna Jenkins 09/01/2011, 7:52 AM

## 2011-09-02 ENCOUNTER — Encounter (HOSPITAL_COMMUNITY): Payer: Self-pay | Admitting: Obstetrics and Gynecology

## 2011-09-02 NOTE — Discharge Summary (Signed)
Obstetric Discharge Summary Reason for Admission: induction of labor Prenatal Procedures: ultrasound Intrapartum Procedures: cesarean: low cervical, transverse Postpartum Procedures: none Complications-Operative and Postpartum: none Hemoglobin  Date Value Range Status  08/31/2011 8.3* 12.0-15.0 (Jenkins/dL) Final     HCT  Date Value Range Status  08/31/2011 25.3* 36.0-46.0 (%) Final    Discharge Diagnoses: Term Pregnancy-delivered  Discharge Information: Date: 09/02/2011 Activity: pelvic rest Diet: routine Medications: PNV, Ibuprofen and Percocet Condition: stable Instructions: refer to practice specific booklet Discharge to: home   Newborn Data: Live born female  Birth Weight: 9 lb 3.3 oz ([id number] Jenkins) APGAR: 9, 9  Home with mother.  Breanna Jenkins 09/02/2011, 8:06 AM

## 2011-09-07 ENCOUNTER — Inpatient Hospital Stay (HOSPITAL_COMMUNITY)
Admission: AD | Admit: 2011-09-07 | Discharge: 2011-09-07 | Disposition: A | Discharge status: Home / Self Care | Payer: Managed Care, Other (non HMO) | Source: Ambulatory Visit | Attending: Obstetrics and Gynecology | Admitting: Obstetrics and Gynecology

## 2011-09-07 ENCOUNTER — Encounter (HOSPITAL_COMMUNITY): Payer: Self-pay

## 2011-09-07 DIAGNOSIS — O909 Complication of the puerperium, unspecified: Secondary | ICD-10-CM | POA: Insufficient documentation

## 2011-09-07 DIAGNOSIS — IMO0002 Reserved for concepts with insufficient information to code with codable children: Secondary | ICD-10-CM

## 2011-09-07 DIAGNOSIS — O900 Disruption of cesarean delivery wound: Secondary | ICD-10-CM

## 2011-09-07 LAB — DIFFERENTIAL
Blasts: 0 %
Lymphocytes Relative: 17 % (ref 12–46)
Lymphs Abs: 2 10*3/uL (ref 0.7–4.0)
Monocytes Absolute: 1.2 10*3/uL — ABNORMAL HIGH (ref 0.1–1.0)
Monocytes Relative: 10 % (ref 3–12)
Neutro Abs: 8.5 10*3/uL — ABNORMAL HIGH (ref 1.7–7.7)
Neutrophils Relative %: 69 % (ref 43–77)
nRBC: 0 /100 WBC

## 2011-09-07 LAB — CBC
Platelets: 353 10*3/uL (ref 150–400)
RBC: 3.76 MIL/uL — ABNORMAL LOW (ref 3.87–5.11)
RDW: 15.5 % (ref 11.5–15.5)
WBC: 11.7 10*3/uL — ABNORMAL HIGH (ref 4.0–10.5)

## 2011-09-07 NOTE — ED Provider Notes (Signed)
History   Breanna Jenkins is a 30 y.o. year old G7P1001 female 9 days S/P LTCS who presents to [facility] reporting drainage from incision. She was seen in the office yesterday for steristrip removal.   CSN: [id number] Arrival date & time: 09/07/2011 12:46 AM   None     Chief Complaint  Patient presents with  . Abdominal Pain    (Consider location/radiation/quality/duration/timing/severity/associated sxs/prior treatment) HPI  Past Medical History  Diagnosis Date  . No pertinent past medical history     Past Surgical History  Procedure Date  . Left oophorectomy 2009  . Cesarean section 08/29/2011    Procedure: CESAREAN SECTION;  Surgeon: Jeani Hawking, MD;  Location: [facility];  Service: Gynecology;  Laterality: N/A;    History reviewed. No pertinent family history.  History  Substance Use Topics  . Smoking status: Never Smoker   . Smokeless tobacco: Not on file  . Alcohol Use: No    OB History    Grav Para Term Preterm Abortions TAB SAB Ect Mult Living   1 1 1       1       Review of Systems  Constitutional: Negative for fever and chills.  Gastrointestinal: Negative for abdominal pain.    Allergies  Review of patient's allergies indicates no known allergies.  Home Medications  No current outpatient prescriptions on file.  BP 106/47  Pulse 81  Temp(Src) 97.8 F (36.6 C) (Oral)  Resp 18  Wt 65.318 kg (144 lb)  SpO2 97%  Breastfeeding? Yes  Physical Exam: General: NAD, A&O x 4 Abd: Soft, NT, draining mod amount of serosanguinous, odorless fluid from left side of incision. Two superficial disruptions of incision noted: #1 3 cm long at left end. #2 1.5 cm long between left end and midline. Both probed to 0.5 cm depth. Trace erythema at incision borders, not suggestive of infection.    ED Course  Procedures (including critical care time)  Lab Results  Component Value Date   WBC 11.7* 09/07/2011   HGB 10.1* 09/07/2011   HCT 31.8* 09/07/2011   MCV 84.6  09/07/2011   PLT 353 09/07/2011    MDM  Assessment: 1. Draining seroma w/ no evidence of infection 2. 9 days Post-op C/S  Plan: 1. D/C home per consult w/ Dr. Marcelle Overlie. 2. Call office Monday to schedule F/U appt 3. Infection precautions  Katrinka Blazing, Donnette Macmullen 09/07/2011 4:59 AM

## 2011-09-07 NOTE — Progress Notes (Signed)
Patient had a c-section on 08/29/11 and was discharge on Sunday. She states that she the steristrips was removed yesterday by dr Marcelle Overlie at the office. The husband states that at 2300, red fluids was coming out of the c-section incision site. It is light red non-odorous drainage from the left side of the incision site. The site is non-tender or warm to touch. Patient states that she took one tablet of percocet 5/500mg  yesterday morning. She denies any chills or fever, she is lactating. 4 4x4 gauze placed at site.

## 2014-08-03 ENCOUNTER — Other Ambulatory Visit: Payer: Self-pay | Admitting: Obstetrics and Gynecology

## 2014-08-22 ENCOUNTER — Encounter (HOSPITAL_COMMUNITY): Payer: Self-pay

## 2014-10-17 ENCOUNTER — Emergency Department (HOSPITAL_COMMUNITY)
Admission: EM | Admit: 2014-10-17 | Discharge: 2014-10-17 | Disposition: A | Discharge status: Home / Self Care | Payer: Managed Care, Other (non HMO) | Source: Home / Self Care | Attending: Emergency Medicine | Admitting: Emergency Medicine

## 2014-10-17 ENCOUNTER — Encounter (HOSPITAL_COMMUNITY): Payer: Self-pay

## 2014-10-17 DIAGNOSIS — J069 Acute upper respiratory infection, unspecified: Secondary | ICD-10-CM

## 2014-10-17 DIAGNOSIS — R0982 Postnasal drip: Secondary | ICD-10-CM

## 2014-10-17 NOTE — Discharge Instructions (Signed)
Upper Respiratory Infection, Adult Saline nasal spray Lots of fluids Tylenol or ibuprofen for discomfort OTC meds for symptoms Antihistamine for drainage An upper respiratory infection (URI) is also sometimes known as the common cold. The upper respiratory tract includes the nose, sinuses, throat, trachea, and bronchi. Bronchi are the airways leading to the lungs. Most people improve within 1 week, but symptoms can last up to 2 weeks. A residual cough may last even longer.  CAUSES Many different viruses can infect the tissues lining the upper respiratory tract. The tissues become irritated and inflamed and often become very moist. Mucus production is also common. A cold is contagious. You can easily spread the virus to others by oral contact. This includes kissing, sharing a glass, coughing, or sneezing. Touching your mouth or nose and then touching a surface, which is then touched by another person, can also spread the virus. SYMPTOMS  Symptoms typically develop 1 to 3 days after you come in contact with a cold virus. Symptoms vary from person to person. They may include:  Runny nose.  Sneezing.  Nasal congestion.  Sinus irritation.  Sore throat.  Loss of voice (laryngitis).  Cough.  Fatigue.  Muscle aches.  Loss of appetite.  Headache.  Low-grade fever. DIAGNOSIS  You might diagnose your own cold based on familiar symptoms, since most people get a cold 2 to 3 times a year. Your caregiver can confirm this based on your exam. Most importantly, your caregiver can check that your symptoms are not due to another disease such as strep throat, sinusitis, pneumonia, asthma, or epiglottitis. Blood tests, throat tests, and X-rays are not necessary to diagnose a common cold, but they may sometimes be helpful in excluding other more serious diseases. Your caregiver will decide if any further tests are required. RISKS AND COMPLICATIONS  You may be at risk for a more severe case of the  common cold if you smoke cigarettes, have chronic heart disease (such as heart failure) or lung disease (such as asthma), or if you have a weakened immune system. The very young and very old are also at risk for more serious infections. Bacterial sinusitis, middle ear infections, and bacterial pneumonia can complicate the common cold. The common cold can worsen asthma and chronic obstructive pulmonary disease (COPD). Sometimes, these complications can require emergency medical care and may be life-threatening. PREVENTION  The best way to protect against getting a cold is to practice good hygiene. Avoid oral or hand contact with people with cold symptoms. Wash your hands often if contact occurs. There is no clear evidence that vitamin C, vitamin E, echinacea, or exercise reduces the chance of developing a cold. However, it is always recommended to get plenty of rest and practice good nutrition. TREATMENT  Treatment is directed at relieving symptoms. There is no cure. Antibiotics are not effective, because the infection is caused by a virus, not by bacteria. Treatment may include:  Increased fluid intake. Sports drinks offer valuable electrolytes, sugars, and fluids.  Breathing heated mist or steam (vaporizer or shower).  Eating chicken soup or other clear broths, and maintaining good nutrition.  Getting plenty of rest.  Using gargles or lozenges for comfort.  Controlling fevers with ibuprofen or acetaminophen as directed by your caregiver.  Increasing usage of your inhaler if you have asthma. Zinc gel and zinc lozenges, taken in the first 24 hours of the common cold, can shorten the duration and lessen the severity of symptoms. Pain medicines may help with fever, muscle aches,  and throat pain. A variety of non-prescription medicines are available to treat congestion and runny nose. Your caregiver can make recommendations and may suggest nasal or lung inhalers for other symptoms.  HOME CARE  INSTRUCTIONS   Only take over-the-counter or prescription medicines for pain, discomfort, or fever as directed by your caregiver.  Use a warm mist humidifier or inhale steam from a shower to increase air moisture. This may keep secretions moist and make it easier to breathe.  Drink enough water and fluids to keep your urine clear or pale yellow.  Rest as needed.  Return to work when your temperature has returned to normal or as your caregiver advises. You may need to stay home longer to avoid infecting others. You can also use a face mask and careful hand washing to prevent spread of the virus. SEEK MEDICAL CARE IF:   After the first few days, you feel you are getting worse rather than better.  You need your caregiver's advice about medicines to control symptoms.  You develop chills, worsening shortness of breath, or brown or red sputum. These may be signs of pneumonia.  You develop yellow or brown nasal discharge or pain in the face, especially when you bend forward. These may be signs of sinusitis.  You develop a fever, swollen neck glands, pain with swallowing, or white areas in the back of your throat. These may be signs of strep throat. SEEK IMMEDIATE MEDICAL CARE IF:   You have a fever.  You develop severe or persistent headache, ear pain, sinus pain, or chest pain.  You develop wheezing, a prolonged cough, cough up blood, or have a change in your usual mucus (if you have chronic lung disease).  You develop sore muscles or a stiff neck. Document Released: 04/02/2001 Document Revised: 12/30/2011 Document Reviewed: 01/12/2014 [facility] Patient Information 2015 [place], [place]. This information is not intended to replace advice given to you by your health care provider. Make sure you discuss any questions you have with your health care provider.

## 2014-10-17 NOTE — ED Notes (Signed)
C/o cough x 1 week, w a ot of congestion. Minimal relief w Mucinex. 35 yr old son , husband have been ill w similar symptoms

## 2014-10-17 NOTE — ED Provider Notes (Signed)
CSN: [id number]     Arrival date & time 10/17/14  0907 History   First MD Initiated Contact with Patient 10/17/14 [phone]     Chief Complaint  Patient presents with  . Cough   (Consider location/radiation/quality/duration/timing/severity/associated sxs/prior Treatment) HPI Comments: Pleasant 33 year old female accompanied by her husband complaining of cough, sore throat, PND, swelling of the eyes, itch years for about 3 days. She has taken Mucinex without relief.   Past Medical History  Diagnosis Date  . No pertinent past medical history    Past Surgical History  Procedure Laterality Date  . Left oophorectomy  2009  . Cesarean section  08/29/2011    Procedure: CESAREAN SECTION;  Surgeon: Cyril Mourning, MD;  Location: [facility];  Service: Gynecology;  Laterality: N/A;   History reviewed. No pertinent family history. History  Substance Use Topics  . Smoking status: Never Smoker   . Smokeless tobacco: Not on file  . Alcohol Use: No   OB History    Gravida Para Term Preterm AB TAB SAB Ectopic Multiple Living   1 1 1       1      Review of Systems  Constitutional: Negative for fever, chills, activity change, appetite change and fatigue.  HENT: Positive for congestion, postnasal drip, rhinorrhea and sore throat. Negative for ear pain and facial swelling.   Eyes: Negative.   Respiratory: Positive for cough. Negative for shortness of breath and wheezing.   Cardiovascular: Negative.   Gastrointestinal: Negative.   Musculoskeletal: Negative for neck pain and neck stiffness.  Skin: Negative for pallor and rash.  Neurological: Negative.     Allergies  Review of patient's allergies indicates no known allergies.  Home Medications   Prior to Admission medications   Medication Sig Start Date End Date Taking? Authorizing Provider  prenatal vitamin w/FE, FA (PRENATAL 1 + 1) 27-1 MG TABS Take 1 tablet by mouth daily.      Historical Provider, MD   BP 96/57 mmHg  Pulse 69  Temp(Src)  98.4 F (36.9 C) (Oral)  Resp 14  SpO2 99% Physical Exam  Constitutional: She is oriented to person, place, and time. She appears well-developed and well-nourished. No distress.  HENT:  Mouth/Throat: No oropharyngeal exudate.  Bilateral TMs are normal Oropharynx with clear PND and cobblestoning   Eyes: Conjunctivae and EOM are normal.  Neck: Normal range of motion. Neck supple.  Cardiovascular: Normal rate, regular rhythm and normal heart sounds.   Pulmonary/Chest: Effort normal and breath sounds normal. No respiratory distress. She has no wheezes. She has no rales.  Musculoskeletal: Normal range of motion. She exhibits no edema.  Lymphadenopathy:    She has no cervical adenopathy.  Neurological: She is alert and oriented to person, place, and time.  Skin: Skin is warm and dry. No rash noted.  Psychiatric: She has a normal mood and affect.  Nursing note and vitals reviewed.   ED Course  Procedures (including critical care time) Labs Review Labs Reviewed - No data to display  Imaging Review No results found.   MDM   1. URI (upper respiratory infection)   2. PND (post-nasal drip)    Saline nasal spray Lots of fluids Tylenol or ibuprofen for discomfort OTC meds for symptoms Antihistamine for drainage Zaditor eye drop prn    Janne Napoleon, NP 10/17/14 0945

## 2014-10-24 ENCOUNTER — Other Ambulatory Visit: Payer: Self-pay | Admitting: Gynecology

## 2014-10-25 LAB — CYTOLOGY - PAP

## 2016-05-20 ENCOUNTER — Ambulatory Visit (INDEPENDENT_AMBULATORY_CARE_PROVIDER_SITE_OTHER): Payer: Managed Care, Other (non HMO) | Admitting: Emergency Medicine

## 2016-05-20 DIAGNOSIS — R21 Rash and other nonspecific skin eruption: Secondary | ICD-10-CM | POA: Diagnosis not present

## 2016-05-20 MED ORDER — TRIAMCINOLONE ACETONIDE 0.1 % EX CREA: 1.0000 "application " | TOPICAL_CREAM | Freq: Two times a day (BID) | CUTANEOUS | 0 refills | Status: DC

## 2016-05-20 NOTE — Assessment & Plan Note (Addendum)
Most likely she has a dermatitis. Doesn't appear to be infected and no prior history of atopic dermatitis.  KOH negative today  - prescribed topical triamcinolone  - encouraged twice daily emollients  - advised to wear gloves at work if dealing with chemicals.  - advised to follow up in 2 weeks if no improvement.

## 2016-05-20 NOTE — Progress Notes (Signed)
[facility] [address] [place] [address] [phone]  Date:  05/20/2016   Name:  Breanna Jenkins   DOB:  05-06-1981   MRN:  [id number]  PCP:  No primary care provider on file.    Chief Complaint: Other (right thumb, and left index x 2 wks ago, itchy)   History of Present Illness:  This is a 35 y.o. female who is presenting with finger pain. She cut her left fifth finger. She tried some neosporin with no improvement. No pain but it is itchy. Denies any fevers, chills or night sweats. She endorses it being itchy.  She also has a cut on her posterior thumb. No allergies and no eczema. She works in [employer] and works with chemicals. This has happened before but she put neosporin on it and it went away. Has not tried any emollients.    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 Also occurring on dorsal aspect of thumb on right hand. No streaking. No discharge. Neurovascularly intact Neuro: no gross deficits.  Psych: alert and oriented   Assessment and Plan:  Rash Most likely she has a dermatitis. Doesn't appear to be infected and no prior history of atopic dermatitis.  KOH negative today  - prescribed topical triamcinolone  - encouraged twice daily emollients  - advised to wear gloves at work if dealing  with chemicals.  - advised to follow up in 2 weeks if no improvement.

## 2016-05-20 NOTE — Patient Instructions (Addendum)
Thank you for coming in,   Please try to wear gloves when you are at work.   Please try to apply Vaseline or Aveeno twice a day.   Please let us know if there is no improvement in symptoms.    Please feel free to call with any questions or concerns at any time, at [phone]. --Dr. Raeford Razor    IF you received an x-ray today, you will receive an invoice from [facility]. Please contact [facility] at [phone] with questions or concerns regarding your invoice.   IF you received labwork today, you will receive an invoice from [employer]. Please contact [facility] at [phone] with questions or concerns regarding your invoice.   Our billing staff will not be able to assist you with questions regarding bills from these companies.  You will be contacted with the lab results as soon as they are available. The fastest way to get your results is to activate your My Chart account. Instructions are located on the last page of this paperwork. If you have not heard from [place] regarding the results in 2 weeks, please contact this office.

## 2018-05-20 ENCOUNTER — Ambulatory Visit
Admission: RE | Admit: 2018-05-20 | Discharge: 2018-05-20 | Disposition: A | Discharge status: Home / Self Care | Payer: Managed Care, Other (non HMO) | Source: Ambulatory Visit | Attending: Internal Medicine | Admitting: Internal Medicine

## 2018-05-20 ENCOUNTER — Other Ambulatory Visit: Payer: Self-pay | Admitting: Internal Medicine

## 2018-05-20 DIAGNOSIS — N200 Calculus of kidney: Secondary | ICD-10-CM

## 2018-06-08 NOTE — Patient Instructions (Addendum)
Your procedure is scheduled on: Wednesday June 17, 2018 at 7:30 am  Enter through the Main Entrance of [facility] at: 6:00 am  Pick up the phone at the desk and dial [phone].  Call this number if you have problems the morning of surgery: [phone].  Remember: Do NOT eat food or drink any liquids after: Midnight on Tuesday August 27  Take these medicines the morning of surgery with a SIP OF WATER: NONE  STOP ALL VITAMINS, SUPPLEMENTS, HERBAL MEDICATIONS NOW  DO NOT SMOKE DAY OF SURGERY  BRUSH YOUR TEETH DAY OF SURGERY  Do NOT wear jewelry (body piercing), metal hair clips/bobby pins, make-up, or nail polish. Do NOT wear lotions, powders, or perfumes.  You may wear deoderant. Do NOT shave for 48 hours prior to surgery. Do NOT bring valuables to the hospital. Contacts, dentures, or bridgework may not be worn into surgery. Leave suitcase in car.  After surgery it may be brought to your room.    For patients admitted to the hospital, checkout time is 11:00 AM the day of discharge.

## 2018-06-09 ENCOUNTER — Inpatient Hospital Stay (HOSPITAL_COMMUNITY): Admission: RE | Admit: 2018-06-09 | Payer: Managed Care, Other (non HMO) | Source: Ambulatory Visit

## 2018-06-10 ENCOUNTER — Other Ambulatory Visit (HOSPITAL_COMMUNITY): Payer: Managed Care, Other (non HMO)

## 2018-06-10 ENCOUNTER — Encounter (HOSPITAL_COMMUNITY): Payer: Self-pay

## 2018-06-10 ENCOUNTER — Other Ambulatory Visit: Payer: Self-pay

## 2018-06-10 ENCOUNTER — Encounter (HOSPITAL_COMMUNITY)
Admission: RE | Admit: 2018-06-10 | Discharge: 2018-06-10 | Disposition: A | Discharge status: Home / Self Care | Payer: Managed Care, Other (non HMO) | Source: Ambulatory Visit | Attending: Obstetrics & Gynecology | Admitting: Obstetrics & Gynecology

## 2018-06-10 DIAGNOSIS — Z01818 Encounter for other preprocedural examination: Secondary | ICD-10-CM | POA: Insufficient documentation

## 2018-06-10 DIAGNOSIS — R1909 Other intra-abdominal and pelvic swelling, mass and lump: Secondary | ICD-10-CM | POA: Diagnosis not present

## 2018-06-10 LAB — COMPREHENSIVE METABOLIC PANEL
ALT: 22 U/L (ref 0–44)
ANION GAP: 9 (ref 5–15)
AST: 21 U/L (ref 15–41)
Albumin: 4.1 g/dL (ref 3.5–5.0)
Alkaline Phosphatase: 38 U/L (ref 38–126)
BILIRUBIN TOTAL: 0.7 mg/dL (ref 0.3–1.2)
BUN: 11 mg/dL (ref 6–20)
CHLORIDE: 102 mmol/L (ref 98–111)
CO2: 25 mmol/L (ref 22–32)
Calcium: 8.7 mg/dL — ABNORMAL LOW (ref 8.9–10.3)
Creatinine, Ser: 0.49 mg/dL (ref 0.44–1.00)
GFR calc Af Amer: >60 mL/min (ref >60)
Glucose, Bld: 93 mg/dL (ref 70–99)
Potassium: 3.9 mmol/L (ref 3.5–5.1)
Sodium: 136 mmol/L (ref 135–145)
TOTAL PROTEIN: 7 g/dL (ref 6.5–8.1)

## 2018-06-10 LAB — CBC
HEMATOCRIT: 35.8 % — AB (ref 36.0–46.0)
Hemoglobin: 11.5 g/dL — ABNORMAL LOW (ref 12.0–15.0)
MCH: 26 pg (ref 26.0–34.0)
MCHC: 32.1 g/dL (ref 30.0–36.0)
MCV: 80.8 fL (ref 78.0–100.0)
PLATELETS: 257 10*3/uL (ref 150–400)
RBC: 4.43 MIL/uL (ref 3.87–5.11)
RDW: 13.8 % (ref 11.5–15.5)
WBC: 8.4 10*3/uL (ref 4.0–10.5)

## 2018-06-10 LAB — TYPE AND SCREEN
ABO/RH(D): B POS
ANTIBODY SCREEN: NEGATIVE

## 2018-06-10 LAB — ABO/RH: ABO/RH(D): B POS

## 2018-06-15 NOTE — H&P (Signed)
Breanna Jenkins is an 37 y.o. female with right adnexal mass presenting for surgical management.  Initially, the patient reported right flank pain with acute onset and had CT which showed normal appendix and no nephrolithiasis but did reveal a 10 cm right complet adnexal mass. Office u/s showed 11 cm complex right adnexal mass with internal echoes suspicious for mucinous cystadenoma; no blood flow within the cyst and blood flow present to the ovary.  CA-125 is 21.  A similar situation occurred in 2009 and 12 cm left adnexal mass was found on ultrasound.  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  Again, in 2015, a 6 cm right ovarian cyst was removed laparoscopically with pathology showing serous cyst.  The patient has completed childbearing and wishes to proceed with definitive management.   Pertinent Gynecological History: Menses: flow is moderate Bleeding: regular Contraception: none DES exposure: unknown Blood transfusions: none Sexually transmitted diseases: no past history Previous GYN Procedures: DNC and LSO, l/s right ovarian cystectomy, C/S  Last mammogram: n/a Date: n/a Last pap: normal Date: 01/2018 OB History: G1, P1001   Menstrual History: Menarche age: n/a Patient's last menstrual period was 05/17/2018 (exact date).    Past Medical History:  Diagnosis Date  . No pertinent past medical history     Past Surgical History:  Procedure Laterality Date  . CESAREAN SECTION  08/29/2011   Procedure: CESAREAN SECTION;  Surgeon: Cyril Mourning, MD;  Location: [facility];  Service: Gynecology;  Laterality: N/A;  . LASIK    . LEFT OOPHORECTOMY  2009    No family history on file.  Social History:  reports that she has never smoked. She has never used smokeless tobacco. She reports that she does not drink alcohol or use drugs.  Allergies: No Known Allergies  No medications prior to admission.     ROS  Last menstrual period 05/17/2018, currently breastfeeding. Physical Exam  Constitutional: She is oriented to person, place, and time. She appears well-developed and well-nourished.  GI: Soft. There is no rebound and no guarding.  Neurological: She is alert and oriented to person, place, and time.  Skin: Skin is warm and dry.  Psychiatric: She has a normal mood and affect. Her behavior is normal.    No results found for this or any previous visit (from the past 24 hour(s)).  No results found.  Assessment/Plan: 37yo with large right adnexal mass -Right salpingo-oophorectomy -Patient has been counseled re: risk of bleeding, infection, scarring, and damage to surrounding structures.  She understands that removal of her only remaining ovary will render her menopausal and therefore unable to naturally conceive children.  She is also informed of the risks of menopause including decline in CV health, bone mineral density loss, and vasomotor symptoms. All questions were answered and the patient wishes to proceed.  Master Touchet, [place] 06/15/2018, 8:39 PM

## 2018-06-16 NOTE — Anesthesia Preprocedure Evaluation (Addendum)
Anesthesia Evaluation  Patient identified by MRN, date of birth, ID band Patient awake    Reviewed: Allergy & Precautions, NPO status , Patient's Chart, lab work & pertinent test results  Airway Mallampati: I  TM Distance: >3 FB Neck ROM: Full    Dental no notable dental hx. (+) Teeth Intact, Dental Advisory Given   Pulmonary    Pulmonary exam normal breath sounds clear to auscultation       Cardiovascular Normal cardiovascular exam Rhythm:Regular Rate:Normal     Neuro/Psych negative neurological ROS  negative psych ROS   GI/Hepatic negative GI ROS, Neg liver ROS,   Endo/Other  negative endocrine ROS  Renal/GU negative Renal ROS  negative genitourinary   Musculoskeletal negative musculoskeletal ROS (+)   Abdominal   Peds  Hematology negative hematology ROS (+)   Anesthesia Other Findings Right adnexal mass  Reproductive/Obstetrics                            Anesthesia Physical Anesthesia Plan  ASA: I  Anesthesia Plan: General   Post-op Pain Management:    Induction: Intravenous  PONV Risk Score and Plan: 3 and Midazolam, Dexamethasone and Ondansetron  Airway Management Planned: Oral ETT  Additional Equipment:   Intra-op Plan:   Post-operative Plan: Extubation in OR  Informed Consent: I have reviewed the patients History and Physical, chart, labs and discussed the procedure including the risks, benefits and alternatives for the proposed anesthesia with the patient or authorized representative who has indicated his/her understanding and acceptance.   Dental advisory given  Plan Discussed with: CRNA  Anesthesia Plan Comments:        Anesthesia Quick Evaluation

## 2018-06-17 ENCOUNTER — Other Ambulatory Visit: Payer: Self-pay

## 2018-06-17 ENCOUNTER — Ambulatory Visit (HOSPITAL_COMMUNITY): Payer: Managed Care, Other (non HMO) | Admitting: Anesthesiology

## 2018-06-17 ENCOUNTER — Observation Stay (HOSPITAL_COMMUNITY)
Admission: RE | Admit: 2018-06-17 | Discharge: 2018-06-18 | Disposition: A | Discharge status: Home / Self Care | Payer: Managed Care, Other (non HMO) | Source: Ambulatory Visit | Attending: Obstetrics & Gynecology | Admitting: Obstetrics & Gynecology

## 2018-06-17 ENCOUNTER — Encounter (HOSPITAL_COMMUNITY): Payer: Self-pay

## 2018-06-17 ENCOUNTER — Encounter (HOSPITAL_COMMUNITY)
Admission: RE | Disposition: A | Discharge status: Home / Self Care | Payer: Self-pay | Source: Ambulatory Visit | Attending: Obstetrics & Gynecology

## 2018-06-17 DIAGNOSIS — Z90721 Acquired absence of ovaries, unilateral: Secondary | ICD-10-CM | POA: Diagnosis not present

## 2018-06-17 DIAGNOSIS — C561 Malignant neoplasm of right ovary: Secondary | ICD-10-CM | POA: Diagnosis not present

## 2018-06-17 DIAGNOSIS — R1900 Intra-abdominal and pelvic swelling, mass and lump, unspecified site: Secondary | ICD-10-CM | POA: Diagnosis present

## 2018-06-17 HISTORY — PX: SALPINGOOPHORECTOMY: SHX82

## 2018-06-17 HISTORY — PX: LAPAROTOMY: SHX154

## 2018-06-17 LAB — PREGNANCY, URINE: PREG TEST UR: NEGATIVE

## 2018-06-17 SURGERY — LAPAROTOMY
Anesthesia: General | Site: Abdomen | Laterality: Right

## 2018-06-17 MED ORDER — HYDROMORPHONE HCL 1 MG/ML IJ SOLN
INTRAMUSCULAR | Status: DC | PRN
  Administered 2018-06-17: 1 mg via INTRAVENOUS

## 2018-06-17 MED ORDER — ONDANSETRON HCL 4 MG/2ML IJ SOLN: 4.0000 mg | Freq: Four times a day (QID) | INTRAMUSCULAR | Status: DC | PRN

## 2018-06-17 MED ORDER — ROCURONIUM BROMIDE 100 MG/10ML IV SOLN
INTRAVENOUS | Status: AC
  Filled 2018-06-17: qty 1

## 2018-06-17 MED ORDER — DEXAMETHASONE SODIUM PHOSPHATE 10 MG/ML IJ SOLN
INTRAMUSCULAR | Status: DC | PRN
  Administered 2018-06-17: 10 mg via INTRAVENOUS

## 2018-06-17 MED ORDER — PROPOFOL 10 MG/ML IV BOLUS
INTRAVENOUS | Status: DC | PRN
  Administered 2018-06-17: 100 mg via INTRAVENOUS

## 2018-06-17 MED ORDER — SIMETHICONE 80 MG PO CHEW: 80.0000 mg | CHEWABLE_TABLET | Freq: Four times a day (QID) | ORAL | Status: DC | PRN

## 2018-06-17 MED ORDER — ONDANSETRON HCL 4 MG PO TABS: 4.0000 mg | ORAL_TABLET | Freq: Four times a day (QID) | ORAL | Status: DC | PRN

## 2018-06-17 MED ORDER — HYDROMORPHONE HCL 1 MG/ML IJ SOLN
0.2500 mg | INTRAMUSCULAR | Status: DC | PRN
  Administered 2018-06-17 (×2): 0.25 mg via INTRAVENOUS

## 2018-06-17 MED ORDER — FENTANYL CITRATE (PF) 100 MCG/2ML IJ SOLN: INTRAMUSCULAR | Status: DC | PRN

## 2018-06-17 MED ORDER — LACTATED RINGERS IV SOLN
INTRAVENOUS | Status: DC
  Administered 2018-06-17 – 2018-06-18 (×3): via INTRAVENOUS

## 2018-06-17 MED ORDER — MENTHOL 3 MG MT LOZG: 1.0000 | LOZENGE | OROMUCOSAL | Status: DC | PRN

## 2018-06-17 MED ORDER — LIDOCAINE HCL (CARDIAC) PF 100 MG/5ML IV SOSY
PREFILLED_SYRINGE | INTRAVENOUS | Status: AC
  Filled 2018-06-17: qty 5

## 2018-06-17 MED ORDER — FENTANYL CITRATE (PF) 100 MCG/2ML IJ SOLN
INTRAMUSCULAR | Status: DC | PRN
  Administered 2018-06-17 (×4): 50 ug via INTRAVENOUS

## 2018-06-17 MED ORDER — HYDROMORPHONE HCL 1 MG/ML IJ SOLN: 0.2000 mg | INTRAMUSCULAR | Status: DC | PRN

## 2018-06-17 MED ORDER — KETOROLAC TROMETHAMINE 30 MG/ML IJ SOLN
30.0000 mg | Freq: Four times a day (QID) | INTRAMUSCULAR | Status: DC
  Administered 2018-06-17 – 2018-06-18 (×4): 30 mg via INTRAVENOUS
  Filled 2018-06-17 (×3): qty 1

## 2018-06-17 MED ORDER — PROPOFOL 10 MG/ML IV BOLUS
INTRAVENOUS | Status: AC
  Filled 2018-06-17: qty 20

## 2018-06-17 MED ORDER — NALOXONE HCL 0.4 MG/ML IJ SOLN
INTRAMUSCULAR | Status: DC | PRN
  Administered 2018-06-17: .08 mg via INTRAVENOUS

## 2018-06-17 MED ORDER — DOCUSATE SODIUM 100 MG PO CAPS
100.0000 mg | ORAL_CAPSULE | Freq: Two times a day (BID) | ORAL | Status: DC
  Administered 2018-06-17: 100 mg via ORAL
  Filled 2018-06-17: qty 1

## 2018-06-17 MED ORDER — MIDAZOLAM HCL 2 MG/2ML IJ SOLN
INTRAMUSCULAR | Status: DC | PRN
  Administered 2018-06-17: 1 mg via INTRAVENOUS

## 2018-06-17 MED ORDER — GLYCOPYRROLATE 0.2 MG/ML IJ SOLN
INTRAMUSCULAR | Status: AC
  Filled 2018-06-17: qty 1

## 2018-06-17 MED ORDER — CEFAZOLIN SODIUM-DEXTROSE 2-4 GM/100ML-% IV SOLN
2.0000 g | INTRAVENOUS | Status: AC
  Administered 2018-06-17: 2 g via INTRAVENOUS

## 2018-06-17 MED ORDER — MIDAZOLAM HCL 2 MG/2ML IJ SOLN
INTRAMUSCULAR | Status: AC
  Filled 2018-06-17: qty 2

## 2018-06-17 MED ORDER — ROCURONIUM BROMIDE 100 MG/10ML IV SOLN
INTRAVENOUS | Status: DC | PRN
  Administered 2018-06-17: 30 mg via INTRAVENOUS

## 2018-06-17 MED ORDER — SUGAMMADEX SODIUM 200 MG/2ML IV SOLN
INTRAVENOUS | Status: DC | PRN
  Administered 2018-06-17: 110 mg via INTRAVENOUS

## 2018-06-17 MED ORDER — SUGAMMADEX SODIUM 200 MG/2ML IV SOLN
INTRAVENOUS | Status: AC
  Filled 2018-06-17: qty 2

## 2018-06-17 MED ORDER — SCOPOLAMINE 1 MG/3DAYS TD PT72
MEDICATED_PATCH | TRANSDERMAL | Status: AC
  Administered 2018-06-17: 1.5 mg via TRANSDERMAL
  Filled 2018-06-17: qty 1

## 2018-06-17 MED ORDER — GLYCOPYRROLATE 0.2 MG/ML IJ SOLN
INTRAMUSCULAR | Status: DC | PRN
  Administered 2018-06-17: 0.1 mg via INTRAVENOUS

## 2018-06-17 MED ORDER — HYDROMORPHONE HCL 1 MG/ML IJ SOLN
INTRAMUSCULAR | Status: AC
  Filled 2018-06-17: qty 1

## 2018-06-17 MED ORDER — LIDOCAINE HCL (CARDIAC) PF 100 MG/5ML IV SOSY
PREFILLED_SYRINGE | INTRAVENOUS | Status: DC | PRN
  Administered 2018-06-17: 100 mg via INTRAVENOUS

## 2018-06-17 MED ORDER — SCOPOLAMINE 1 MG/3DAYS TD PT72
1.0000 | MEDICATED_PATCH | Freq: Once | TRANSDERMAL | Status: DC
  Administered 2018-06-17: 1.5 mg via TRANSDERMAL

## 2018-06-17 MED ORDER — LACTATED RINGERS IV SOLN
INTRAVENOUS | Status: DC
  Administered 2018-06-17: 125 mL/h via INTRAVENOUS

## 2018-06-17 MED ORDER — ONDANSETRON HCL 4 MG/2ML IJ SOLN
INTRAMUSCULAR | Status: AC
  Filled 2018-06-17: qty 2

## 2018-06-17 MED ORDER — FENTANYL CITRATE (PF) 250 MCG/5ML IJ SOLN
INTRAMUSCULAR | Status: AC
  Filled 2018-06-17: qty 5

## 2018-06-17 MED ORDER — DEXAMETHASONE SODIUM PHOSPHATE 10 MG/ML IJ SOLN
INTRAMUSCULAR | Status: AC
  Filled 2018-06-17: qty 1

## 2018-06-17 MED ORDER — CEFAZOLIN SODIUM-DEXTROSE 2-4 GM/100ML-% IV SOLN
INTRAVENOUS | Status: AC
  Filled 2018-06-17: qty 100

## 2018-06-17 MED ORDER — OXYCODONE-ACETAMINOPHEN 5-325 MG PO TABS
1.0000 | ORAL_TABLET | ORAL | Status: DC | PRN
  Administered 2018-06-17 (×2): 1 via ORAL
  Filled 2018-06-17 (×2): qty 1

## 2018-06-17 MED ORDER — KETOROLAC TROMETHAMINE 30 MG/ML IJ SOLN
30.0000 mg | Freq: Four times a day (QID) | INTRAMUSCULAR | Status: DC
  Filled 2018-06-17: qty 1

## 2018-06-17 MED ORDER — ONDANSETRON HCL 4 MG/2ML IJ SOLN
INTRAMUSCULAR | Status: DC | PRN
  Administered 2018-06-17: 4 mg via INTRAVENOUS

## 2018-06-17 MED ORDER — HYDROMORPHONE HCL 1 MG/ML IJ SOLN
INTRAMUSCULAR | Status: AC
  Filled 2018-06-17: qty 0.5

## 2018-06-17 SURGICAL SUPPLY — 29 items
BENZOIN TINCTURE PRP APPL 2/3 (GAUZE/BANDAGES/DRESSINGS) ×3 IMPLANT
CANISTER SUCT 3000ML PPV (MISCELLANEOUS) ×3 IMPLANT
CLOSURE WOUND 1/2 X4 (GAUZE/BANDAGES/DRESSINGS) ×1
CONT PATH 16OZ SNAP LID 3702 (MISCELLANEOUS) ×3 IMPLANT
DRAPE WARM FLUID 44X44 (DRAPE) IMPLANT
DRSG OPSITE POSTOP 4X10 (GAUZE/BANDAGES/DRESSINGS) ×3 IMPLANT
DURAPREP 26ML APPLICATOR (WOUND CARE) ×6 IMPLANT
GAUZE 4X4 16PLY RFD (DISPOSABLE) IMPLANT
GLOVE BIO SURGEON STRL SZ 6 (GLOVE) ×3 IMPLANT
GLOVE BIOGEL PI IND STRL 6 (GLOVE) ×2 IMPLANT
GLOVE BIOGEL PI IND STRL 7.0 (GLOVE) ×1 IMPLANT
GLOVE BIOGEL PI INDICATOR 6 (GLOVE) ×4
GLOVE BIOGEL PI INDICATOR 7.0 (GLOVE) ×2
GOWN STRL REUS W/TWL LRG LVL3 (GOWN DISPOSABLE) ×9 IMPLANT
NS IRRIG 1000ML POUR BTL (IV SOLUTION) ×3 IMPLANT
PACK ABDOMINAL GYN (CUSTOM PROCEDURE TRAY) ×3 IMPLANT
PAD OB MATERNITY 4.3X12.25 (PERSONAL CARE ITEMS) ×3 IMPLANT
PROTECTOR NERVE ULNAR (MISCELLANEOUS) ×6 IMPLANT
SPONGE LAP 18X18 X RAY DECT (DISPOSABLE) ×6 IMPLANT
STRIP CLOSURE SKIN 1/2X4 (GAUZE/BANDAGES/DRESSINGS) ×2 IMPLANT
SUT MON AB-0 CT1 36 (SUTURE) ×3 IMPLANT
SUT PLAIN 2 0 XLH (SUTURE) IMPLANT
SUT VIC AB 0 CT1 18XCR BRD8 (SUTURE) IMPLANT
SUT VIC AB 0 CT1 27 (SUTURE) ×12
SUT VIC AB 0 CT1 27XBRD ANBCTR (SUTURE) ×6 IMPLANT
SUT VIC AB 0 CT1 8-18 (SUTURE)
SUT VIC AB 4-0 KS 27 (SUTURE) ×3 IMPLANT
TOWEL OR 17X24 6PK STRL BLUE (TOWEL DISPOSABLE) ×6 IMPLANT
TRAY FOLEY W/BAG SLVR 14FR (SET/KITS/TRAYS/PACK) ×3 IMPLANT

## 2018-06-17 NOTE — Op Note (Signed)
Breanna Jenkins PROCEDURE DATE: 06/17/2018  PREOPERATIVE DIAGNOSIS:  Right adnexal mass POSTOPERATIVE DIAGNOSIS:  SAA SURGEON:   Dr. Linda Hedges ASSISTANT:   Dr. Arvella Nigh OPERATION:  Laparotomy with right salpingo-oophorectomy, washings and frozen pathology ANESTHESIA:  General endotracheal.  INDICATIONS: The patient is a 37 y.o. G1P1001 with history of abdominal pain and 11 cm right ovarian cyst.  CA-125 wnl. Patient previously underwent LSO 10 years ago and she understands the expectations and risks of surgical menopause.  The patient made a decision to undergo definite surgical treatment. On the preoperative visit, the risks, benefits, indications, and alternatives of the procedure were reviewed with the patient.  On the day of surgery, the risks of surgery were again discussed with the patient including but not limited to: bleeding which may require transfusion or reoperation; infection which may require antibiotics; injury to bowel, bladder, ureters or other surrounding organs; need for additional procedures; thromboembolic phenomenon, incisional problems and other postoperative/anesthesia complications. Written informed consent was obtained.    OPERATIVE FINDINGS: A small uterus with surgically absent left fallopian tube and ovary.  Large right ovarian cyst with palpable densities within it.  Small amount of spillage of clear, mucinous material on removal from abdomen.   ESTIMATED BLOOD LOSS: 75 ml SPECIMENS:  1. Pelvic washings  2. Right fallopian tube and ovary sent for frozen; mucinous cystic neoplasm COMPLICATIONS:  None immediate.   DESCRIPTION OF PROCEDURE: The patient received intravenous antibiotics and had sequential compression devices applied to her lower extremities while in the preoperative area.   She was taken to the operating room and placed under general anesthesia without difficulty and found to be adequate.The abdomen and perineum were prepped and draped in a sterile  manner, and a Foley catheter was inserted into the bladder and attached to constant drainage. After an adequate timeout was performed, a Pfannensteil skin incision was made. This incision was taken down to the fascia using electrocautery with care given to maintain good hemostasis. The fascia was incised in the midline and the fascial incision was then extended bilaterally using electrocautery without difficulty. The fascia was then dissected off the underlying rectus muscles using blunt and sharp dissection. The rectus muscles were split bluntly in the midline and the peritoneum entered sharply without complication. This peritoneal incision was then extended superiorly and inferiorly with care given to prevent bowel or bladder injury. Upon entry into the abdominal cavity, the upper abdomen was inspected and found to be normal. Attention was then turned to the pelvis. Pelvic washings were collected. The large right adnexal mass was elevated out of the pelvis with a small amount of spillage of clear, mucinous material.  No normal ovarian tissue was identifiable.  The IP was double clamped and the fallopian tube and ovary were removed.  This specimen was sent for frozen section.  The pedicle was suture ligated using 0 vicryl suture.  Hemostasis was observed.  The pelvis was irrigated and hemostasis was reconfirmed.  All laparotomy sponges and instruments were removed from the abdomen. The peritoneum was closed with 2-0 monocryl, and the fascia was closed with 0 Vicryl in a running fashion. The skin was closed with a 4-0 vicryl subcuticular stitch. Sponge, lap, needle, and instrument counts were correct times two. The patient was taken to the recovery area awake, extubated and in stable condition.

## 2018-06-17 NOTE — Progress Notes (Signed)
Day of Surgery Procedure(s) (LRB): Exploratory Laparotomy with Right Salpingo ophorectomy (Right)  Subjective: Patient reports incisional pain and tolerating PO.  Catheter in.  Objective: I have reviewed patient's vital signs, intake and output and medications.  General: alert, cooperative and appears stated age Extremities: extremities normal, atraumatic, no cyanosis or edema Abd: soft, ND, dressing c/d  Assessment: s/p Procedure(s) with comments: Exploratory Laparotomy with Right Salpingo ophorectomy (Right) - removal of right adnexal mass with possible BSO: stable, progressing well and tolerating diet  Plan: Advance diet Encourage ambulation  LOS: 0 days    Breanna Jenkins 06/17/2018, 1:34 PM

## 2018-06-17 NOTE — Anesthesia Postprocedure Evaluation (Signed)
Anesthesia Post Note  Patient: Breanna Jenkins  Procedure(s) Performed: Exploratory Laparotomy with Right Salpingo ophorectomy (Right Abdomen)     Patient location during evaluation: PACU Anesthesia Type: General Level of consciousness: awake and alert Pain management: pain level controlled Vital Signs Assessment: post-procedure vital signs reviewed and stable Respiratory status: spontaneous breathing, nonlabored ventilation, respiratory function stable and patient connected to nasal cannula oxygen Cardiovascular status: blood pressure returned to baseline and stable Postop Assessment: no apparent nausea or vomiting Anesthetic complications: no    Last Vitals:  Vitals:   06/17/18 1558 06/17/18 1945  BP: (!) 81/48 (!) 82/50  Pulse: 62 (!) 55  Resp: 18 15  Temp: 36.7 C 36.9 C  SpO2: 98% 100%    Last Pain:  Vitals:   06/17/18 1950  TempSrc:   PainSc: 6                  Gabriella Woodhead L Dailey Buccheri

## 2018-06-17 NOTE — Transfer of Care (Signed)
Immediate Anesthesia Transfer of Care Note  Patient: Breanna Jenkins  Procedure(s) Performed: LAPAROTOMY with frozen section (Bilateral Abdomen)  Patient Location: PACU  Anesthesia Type:General  Level of Consciousness: awake, alert  and oriented  Airway & Oxygen Therapy: Patient Spontanous Breathing and Patient connected to nasal cannula oxygen  Post-op Assessment: Report given to RN and Post -op Vital signs reviewed and stable  Post vital signs: Reviewed and stable  Last Vitals:  Vitals Value Taken Time  BP 97/55 06/17/2018  9:00 AM  Temp    Pulse 65 06/17/2018  9:01 AM  Resp 10 06/17/2018  9:01 AM  SpO2 100 % 06/17/2018  9:01 AM  Vitals shown include unvalidated device data.  Last Pain:  Vitals:   06/17/18 0900  TempSrc:   PainSc: Asleep      Patients Stated Pain Goal: 3 (51/83/43 [id number])  Complications: No apparent anesthesia complications

## 2018-06-17 NOTE — Progress Notes (Signed)
No change to H&P.  Patient prefers to keep ovary if able to dissect out cyst.  She is counseled that I will make every attempt to do that.  She also understands that if I'm unable to spare normal ovarian stroma, she will become surgically menopausal.  She agrees.    Linda Hedges, DO

## 2018-06-17 NOTE — Anesthesia Procedure Notes (Signed)
Procedure Name: Intubation Date/Time: 06/17/2018 7:35 AM Performed by: Flossie Dibble, CRNA Pre-anesthesia Checklist: Patient identified, Patient being monitored, Timeout performed, Emergency Drugs available and Suction available Patient Re-evaluated:Patient Re-evaluated prior to induction Oxygen Delivery Method: Circle System Utilized Preoxygenation: Pre-oxygenation with 100% oxygen Induction Type: IV induction Ventilation: Mask ventilation without difficulty Laryngoscope Size: Mac and 3 Grade View: Grade I Tube type: Oral Tube size: 7.0 mm Number of attempts: 1 Airway Equipment and Method: stylet Placement Confirmation: ETT inserted through vocal cords under direct vision,  positive ETCO2 and breath sounds checked- equal and bilateral Secured at: 21 cm Tube secured with: Tape Dental Injury: Teeth and Oropharynx as per pre-operative assessment

## 2018-06-18 ENCOUNTER — Encounter (HOSPITAL_COMMUNITY): Payer: Self-pay | Admitting: Obstetrics & Gynecology

## 2018-06-18 DIAGNOSIS — C561 Malignant neoplasm of right ovary: Secondary | ICD-10-CM | POA: Diagnosis not present

## 2018-06-18 LAB — COMPREHENSIVE METABOLIC PANEL
ALK PHOS: 28 U/L — AB (ref 38–126)
ALT: 13 U/L (ref 0–44)
ANION GAP: 7 (ref 5–15)
AST: 19 U/L (ref 15–41)
Albumin: 2.9 g/dL — ABNORMAL LOW (ref 3.5–5.0)
BILIRUBIN TOTAL: 0.3 mg/dL (ref 0.3–1.2)
BUN: 10 mg/dL (ref 6–20)
CALCIUM: 8 mg/dL — AB (ref 8.9–10.3)
CO2: 25 mmol/L (ref 22–32)
Chloride: 105 mmol/L (ref 98–111)
Creatinine, Ser: 0.5 mg/dL (ref 0.44–1.00)
GFR calc Af Amer: >60 mL/min (ref >60)
GLUCOSE: 112 mg/dL — AB (ref 70–99)
POTASSIUM: 3.2 mmol/L — AB (ref 3.5–5.1)
Sodium: 137 mmol/L (ref 135–145)
TOTAL PROTEIN: 5.5 g/dL — AB (ref 6.5–8.1)

## 2018-06-18 LAB — CBC
HEMATOCRIT: 27.7 % — AB (ref 36.0–46.0)
HEMOGLOBIN: 9.1 g/dL — AB (ref 12.0–15.0)
MCH: 26.2 pg (ref 26.0–34.0)
MCHC: 32.9 g/dL (ref 30.0–36.0)
MCV: 79.8 fL (ref 78.0–100.0)
Platelets: 183 10*3/uL (ref 150–400)
RBC: 3.47 MIL/uL — ABNORMAL LOW (ref 3.87–5.11)
RDW: 13.7 % (ref 11.5–15.5)
WBC: 9.8 10*3/uL (ref 4.0–10.5)

## 2018-06-18 MED ORDER — DOCUSATE SODIUM 100 MG PO CAPS: 100.0000 mg | ORAL_CAPSULE | Freq: Two times a day (BID) | ORAL | 0 refills | Status: DC

## 2018-06-18 MED ORDER — OXYCODONE-ACETAMINOPHEN 5-325 MG PO TABS: 1.0000 | ORAL_TABLET | ORAL | 0 refills | Status: DC | PRN

## 2018-06-18 MED ORDER — IBUPROFEN 600 MG PO TABS: 600.0000 mg | ORAL_TABLET | Freq: Four times a day (QID) | ORAL | 1 refills | Status: DC | PRN

## 2018-06-18 NOTE — Progress Notes (Signed)
1 Day Post-Op Procedure(s) (LRB): Exploratory Laparotomy with Right Salpingo ophorectomy (Right)  Subjective: Patient reports tolerating PO, + flatus and no problems voiding.  Pain well controlled.  No N/V.  Ambulating well.  Objective: I have reviewed patient's vital signs, intake and output, medications and labs.  General: alert, cooperative and appears stated age Extremities: extremities normal, atraumatic, no cyanosis or edema Abd: soft, ND, dressing c/d  Assessment: s/p Procedure(s) with comments: Exploratory Laparotomy with Right Salpingo ophorectomy (Right) - removal of right adnexal mass with possible BSO: stable, progressing well, tolerating diet and ready for discharge  Plan: Discharge home  LOS: 0 days    Breanna Jenkins 06/18/2018, 9:38 AM

## 2018-06-18 NOTE — Progress Notes (Signed)
Patient discharged home with husband. Medications discussed; Follow-up care reviewed; Admission discussed; Discharge instructions reviewed; Prescriptions reviewed; Pain management discussed. Patient verbalized understanding.

## 2018-06-18 NOTE — Addendum Note (Signed)
Addendum  created 06/18/18 0959 by Georgeanne Nim, CRNA   Sign clinical note

## 2018-06-18 NOTE — Anesthesia Postprocedure Evaluation (Signed)
Anesthesia Post Note  Patient: Breanna Jenkins  Procedure(s) Performed: Exploratory Laparotomy with Right Salpingo ophorectomy (Right Abdomen)     Patient location during evaluation: [facility] Anesthesia Type: General Level of consciousness: awake Pain management: pain level controlled Vital Signs Assessment: post-procedure vital signs reviewed and stable Respiratory status: spontaneous breathing Cardiovascular status: stable Postop Assessment: adequate PO intake and no apparent nausea or vomiting Anesthetic complications: no    Last Vitals:  Vitals:   06/18/18 0407 06/18/18 0741  BP: (!) 89/54 (!) 67/35  Pulse: (!) 54 (!) 50  Resp: 16 18  Temp: 36.9 C 36.8 C  SpO2: 100% 100%    Last Pain:  Vitals:   06/18/18 0807  TempSrc:   PainSc: 3    Pain Goal: Patients Stated Pain Goal: 3 (06/18/18 0807)               Everette Rank

## 2018-06-18 NOTE — Discharge Summary (Signed)
Physician Discharge Summary  Patient ID: Breanna Jenkins MRN: [id number] DOB/AGE: 04-09-1981 37 y.o.  Admit date: 06/17/2018 Discharge date: 06/18/2018  Admission Diagnoses: Right adnexal mass  Discharge Diagnoses: SAA Active Problems:   S/P laparotomy   Discharged Condition: good  Hospital Course: Patient was admitted for surgical treatment of large right adnexal mass.  She underwent the anticipated procedure without complication: Laparotomy with RSO; frozen benign mucinous neoplasm.  Her postoperative course was uncomplicated and on HD2, she was meeting all goals and was discharged home.  Consults: None  Significant Diagnostic Studies: labs: 9.1  Treatments: surgery: laparotomy, RSO  Discharge Exam: Blood pressure (!) 67/35, pulse (!) 50, temperature 98.2 F (36.8 C), temperature source Oral, resp. rate 18, height 5' 2.01" (1.575 m), weight 51.4 kg, last menstrual period 06/12/2018, SpO2 100 %, currently breastfeeding. General appearance: alert, cooperative and appears stated age Extremities: extremities normal, atraumatic, no cyanosis or edema Incision/Wound: c/d dressing  Disposition: Discharge disposition: 01-Home or Self Care        Allergies as of 06/18/2018   No Known Allergies     Medication List    TAKE these medications   docusate sodium 100 MG capsule Commonly known as:  COLACE Take 1 capsule (100 mg total) by mouth 2 (two) times daily.   ibuprofen 600 MG tablet Commonly known as:  ADVIL,MOTRIN Take 1 tablet (600 mg total) by mouth every 6 (six) hours as needed.   oxyCODONE-acetaminophen 5-325 MG tablet Commonly known as:  PERCOCET/ROXICET Take 1 tablet by mouth every 4 (four) hours as needed for moderate pain ((when tolerating fluids)).        SignedLinda Hedges 06/18/2018, 9:42 AM

## 2018-06-21 NOTE — Progress Notes (Addendum)
[place] [facility] Note: New Patient FIRST VISIT   Consult was requested by Dr. 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 Ovarian tumor of borderline malignancy, unspecified laterali    ONCOLOGIC SUMMARY 1. 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 Lynnette Caffey 05/2018  HPI: [name]  is a very nice 37 y.o.  P1  Personally 1 month prior to presentation the patient noticed significant pain in the right upper quadrant after she ate dinner.  She states this radiated to her back and was accompanied by nausea and vomiting.  Outside of the symptoms she has had no other issues.  She denies weight loss denies early satiety denies appetite changes denies bloating denies pelvic pain.  She presented with these complaints her primary care provider who ordered CT imaging thinking this might be a kidney stone and she was incidentally found to have a right adnexal mass.  She was then referred on to GYN.  Ca1 25 on May 21, 2018 was 21  On 06/17/18 Dr. Lynnette Caffey took the patient for a laparotomy with RSO,washings, and frozen pathology. The frozen section revealed a benign mucinous neoplasm however the final pathology was as follows: - MUCINOUS BORDERLINE TUMOR WITH FOCAL INTRAEPITHELIAL CARCINOMA, 15 CM. - TUMOR IS LIMITED TO OVARY. - BENIGN FALLOPIAN TUBE. - SEE ONCOLOGY TABLE. Microscopic Comment OVARY or FALLOPIAN TUBE or PRIMARY PERITONEUM: Procedure: Right salpingo-oophorectomy. Specimen Integrity: Intact. However intraoperative leak was noted. Tumor Site: Right ovary. Ovarian Surface Involvement (required only if applicable): Not identified. Fallopian Tube Surface Involvement (required only if applicable): Not identified. Tumor Size: 15 cm. Histologic  Type: Mucinous borderline tumor with intraepithelial carcinoma. Histologic Grade: Implants (required for advanced stage serous/seromucinous borderline tumors only): N/A. Other Tissue/ Organ Involvement: None. Largest Extrapelvic Peritoneal Focus (required only if applicable): N/A. Peritoneal/Ascitic Fluid: Negative. Treatment Effect (required only for high-grade serous carcinomas): N/A. Regional Lymph Nodes: No lymph nodes submitted or found Pathologic Stage Classification (pTNM, AJCC 8th Edition): pT1a, pNX Washings were negative  Therefore she is at least a Stage IC1 (due to surgical spill)  She presents for management recommendations.  She has no complaints today.  Surgery was approximately 6 days ago.   Imported EPIC Oncologic History:   No history exists.    ECOG PERFORMANCE STATUS: 0 - Asymptomatic  Measurement of disease:  Plan will be CEA, CA19-9, and CA125,  . Ca1 25 o 05/21/2018 - 21  Radiology: . 05/20/2018 - CT renal study - reveals the 10cm complex right ovarian mass. NED otherwise, specifically no enlarged lymph nodes.  Outpatient Encounter Medications as of 06/23/2018  Medication Sig  . ibuprofen (ADVIL,MOTRIN) 600 MG tablet Take 1 tablet (600 mg total) by mouth every 6 (six) hours as needed.  Marland Kitchen oxyCODONE-acetaminophen (PERCOCET/ROXICET) 5-325 MG tablet Take 1 tablet by mouth every 4 (four) hours as needed for moderate pain ((when tolerating fluids)). (Patient not taking: Reported on 06/23/2018)  . [DISCONTINUED] docusate sodium (COLACE) 100 MG capsule Take 1 capsule (100 mg total) by mouth 2 (two) times daily. (Patient not taking: Reported on 06/23/2018)   No facility-administered encounter medications on file as of 06/23/2018.    No Known Allergies  Past Medical History:  Diagnosis Date  . No pertinent past medical history    Past Surgical History:  Procedure Laterality Date  . CESAREAN SECTION  08/29/2011   Procedure: CESAREAN SECTION;  Surgeon: Cyril Mourning,  MD;  Location: [facility];  Service: Gynecology;  Laterality: N/A;  . LAPAROTOMY Right 06/17/2018   Laparotomy with Right Salpingo ophorectomy;  Surgeon: Linda Hedges, DO;  Location: [facility];  Service: Gynecology;  Laterality: Right;  removal of right adnexal mass with possible BSO  . LASIK    . 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 SALPINGOOPHORECTOMY Right 06/17/2018   with laparotomy above 05/2018        Past Gynecological History:   GYNECOLOGIC HISTORY:  . Patient's last menstrual period was 06/12/2018 (exact date).  . Menarche: 37 years old . P 1 . Contraceptive none . HRT none  . Last Pap 01/2018 on record neg with neg HRHPV Family Hx:  History reviewed. No pertinent family history. Social Hx:  Marland Kitchen Tobacco use: none . Alcohol use: none . Illicit Drug use: none . Illicit IV Drug use: none    Review of Systems: Review of Systems  Constitutional: Negative.   HENT:  Negative.   Eyes: Negative.   Respiratory: Negative.   Cardiovascular: Negative.   Gastrointestinal: Negative.   Endocrine: Negative.   Genitourinary: Negative.    Musculoskeletal: Negative.   Skin: Negative.   Neurological: Negative.   Hematological: Negative.   Psychiatric/Behavioral: Negative.    Vitals: Last menstrual period 06/12/2018, currently breastfeeding. Vitals:   06/23/18 0921  Weight: 109 lb 4.8 oz (49.6 kg)  Height: 5\' 2"  (1.575 m)    Vitals:   06/23/18 0921  BP: 99/67  Pulse: 70  Resp: 20  Temp: 98.5 F (36.9 C)  SpO2: 100%   Body mass index is 19.99 kg/m.   Physical Exam: General :  Well developed, 37 y.o., female in no apparent distress HEENT:  Normocephalic/atraumatic, symmetric, EOMI, eyelids normal Neck:   Supple, no masses.  Lymphatics:  No cervical/ submandibular/ supraclavicular/ infraclavicular/ inguinal adenopathy Respiratory:  Respirations unlabored, no use of accessory muscles CV:   Deferred Breast:  Deferred Musculoskeletal: No CVA  tenderness, normal muscle strength. Abdomen:  Transverse Soft, non-tender and nondistended. No evidence of hernia. No masses. Extremities:  No lymphedema, no erythema, non-tender. Skin:   Normal inspection Neuro/Psych:  No focal motor deficit, no abnormal mental status. Normal gait. Normal affect. Alert and oriented to person, place, and time  Genito Urinary: Pelvic deferred today as patient just had surgery 6 days ago.   Assessment  Mucinous borderline ovarian tumor  Plan  1. Data reviewed ? We reviewed her operative and pathology reports from her recent surgery ? We reviewed her pathology report from 2009 showing LSO for dermoidI reviewed her referring doctor's office notes and I have summarized in the HPI ? History was obtained from the patient and the chart ? I reviewed her normal preoperative tumor marker and her preoperative CTScan 2. Management discussion ? Although there was intraoperative spill I believe she is low risk for recurrence. ? I do not feel additional surgery, staging, or hysterectomy is indicated. ? She should be seen by GI for consideration of lower and upper endoscopy, although I do not know if pathology favors intestinal type mucinous borderline or endocervical type. ? Her pap is noted 01/2018 negative and negative HRHPV so endocervix likely cleared ? I will plan pelvic on return to check the cervix myself. ? I will present her at Tumor Board to be sure all agree to above plan ? I will  add tumor markers today CEA/CA19-9 3. RTC one month to review above  Isabel Caprice, MD  06/23/2018, 10:55 AM    Cc: Linda Hedges, DO (Referring Ob/Gyn) Patient, No Pcp Per (PCP)

## 2018-06-23 ENCOUNTER — Encounter: Payer: Self-pay | Admitting: Obstetrics

## 2018-06-23 ENCOUNTER — Inpatient Hospital Stay: Payer: Managed Care, Other (non HMO)

## 2018-06-23 ENCOUNTER — Inpatient Hospital Stay: Payer: Managed Care, Other (non HMO) | Attending: Obstetrics | Admitting: Obstetrics

## 2018-06-23 VITALS — BP 99/67 | HR 70 | Temp 98.5°F | Resp 20 | Ht 62.0 in | Wt 109.3 lb

## 2018-06-23 DIAGNOSIS — Z90721 Acquired absence of ovaries, unilateral: Secondary | ICD-10-CM | POA: Diagnosis not present

## 2018-06-23 DIAGNOSIS — D3910 Neoplasm of uncertain behavior of unspecified ovary: Secondary | ICD-10-CM

## 2018-06-23 DIAGNOSIS — D3911 Neoplasm of uncertain behavior of right ovary: Secondary | ICD-10-CM

## 2018-06-23 LAB — CEA (IN HOUSE-CHCC): CEA (CHCC-In House): 1.24 ng/mL (ref 0.00–5.00)

## 2018-06-23 NOTE — Addendum Note (Signed)
Addended by: Precious Haws B on: 06/23/2018 11:05 AM   Modules accepted: Orders

## 2018-06-23 NOTE — Patient Instructions (Addendum)
1. Blood work today 2. Return in one month for pelvic and followup 3. We will discuss your case at our [facility]. We will refer you to a GI doctor for further recommendations/exams sometime in the next month

## 2018-06-24 ENCOUNTER — Ambulatory Visit (INDEPENDENT_AMBULATORY_CARE_PROVIDER_SITE_OTHER): Payer: Managed Care, Other (non HMO) | Admitting: Gastroenterology

## 2018-06-24 ENCOUNTER — Encounter: Payer: Self-pay | Admitting: Gastroenterology

## 2018-06-24 VITALS — BP 92/64 | HR 92 | Ht 62.0 in | Wt 109.2 lb

## 2018-06-24 DIAGNOSIS — D3911 Neoplasm of uncertain behavior of right ovary: Secondary | ICD-10-CM | POA: Diagnosis not present

## 2018-06-24 LAB — CANCER ANTIGEN 19-9: CAN 19-9: 8 U/mL (ref 0–35)

## 2018-06-24 LAB — CA 125: Cancer Antigen (CA) 125: 22.6 U/mL (ref 0.0–38.1)

## 2018-06-24 MED ORDER — SOD PICOSULFATE-MAG OX-CIT ACD 10-3.5-12 MG-GM -GM/160ML PO SOLN: 1.0000 | Freq: Once | ORAL | 0 refills | Status: AC

## 2018-06-24 NOTE — Progress Notes (Signed)
Chief Complaint: For GI Eval  Referring Provider:  No ref. provider found      ASSESSMENT AND PLAN;   #1. 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 Normal CA19-9 and CEA -Recommend EGD and colonoscopy to rule out any GI pathology.  I have discussed risks and benefits in great detail.  This will be scheduled in coming days.  #2. H/O RUQ pain with N/V (resolved)   HPI:    Breanna Jenkins is a 37 y.o. 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 She has been advised to get EGD and colonoscopy performed to rule out any GI etiology/to rule out any GI primary. Patient currently denies having any GI complaints. No nausea, vomiting, heartburn, regurgitation, odynophagia or dysphagia.  No significant diarrhea or constipation.  There is no melena or hematochezia.  No abdominal pain.  According to the notes, she presented with a right upper quadrant abdominal pain with associated nausea/vomiting.  She underwent CT scan of the abdomen pelvis which showed a 15 cm right adnexal mass.  She thereafter underwent laparoscopy with right salpingo-oophorectomy.  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  The GI symptoms have completely resolved.    Past Medical History:  Diagnosis Date  . No pertinent past medical history     Past Surgical History:  Procedure Laterality Date  . CESAREAN SECTION  08/29/2011   Procedure: CESAREAN SECTION;  Surgeon: Cyril Mourning, MD;  Location: [facility];  Service: Gynecology;  Laterality: N/A;  . LAPAROTOMY Right 06/17/2018   Laparotomy with Right Salpingo ophorectomy;  Surgeon: Linda Hedges, DO;  Location: [facility];  Service: Gynecology;  Laterality: Right;  removal of right adnexal mass with possible BSO  . LASIK    . SALPINGOOPHORECTOMY Left 2009   teratoma per  pathology report  . SALPINGOOPHORECTOMY Right 06/17/2018   with laparotomy above 05/2018    Family History  Problem Relation Age of Onset  . Colon cancer Neg Hx   . Esophageal cancer Neg Hx   . Pancreatic cancer Neg Hx   . Diabetes Neg Hx     Social History   Tobacco Use  . Smoking status: Never Smoker  . Smokeless tobacco: Never Used  Substance Use Topics  . Alcohol use: No  . Drug use: No    Current Outpatient Medications  Medication Sig Dispense Refill  . ibuprofen (ADVIL,MOTRIN) 600 MG tablet Take 1 tablet (600 mg total) by mouth every 6 (six) hours as needed. 30 tablet 1  . oxyCODONE-acetaminophen (PERCOCET/ROXICET) 5-325 MG tablet Take 1 tablet by mouth every 4 (four) hours as needed for moderate pain ((when tolerating fluids)). (Patient not taking: Reported on 06/23/2018) 20 tablet 0   No current facility-administered medications for this visit.     No Known Allergies  Review of Systems:  Constitutional: Denies fever, chills, diaphoresis, appetite change and fatigue.  HEENT: Denies photophobia, eye pain, redness, hearing loss, ear pain, congestion, sore throat, rhinorrhea, sneezing, mouth sores, neck pain, neck stiffness and tinnitus.   Respiratory: Denies SOB, DOE, cough, chest tightness,  and wheezing.   Cardiovascular: Denies chest pain, palpitations and leg swelling.  Genitourinary: Denies dysuria, urgency, frequency, hematuria, flank pain and difficulty urinating.  Musculoskeletal: Denies myalgias, back pain, joint swelling, arthralgias and gait problem.  Skin: No rash.  Neurological: Denies dizziness, seizures, syncope, weakness,  light-headedness, numbness and headaches.  Hematological: Denies adenopathy. Easy bruising, personal or family bleeding history  Psychiatric/Behavioral: No anxiety or depression     Physical Exam:    BP 92/64   Pulse 92   Ht 5\' 2"  (1.575 m)   Wt 109 lb 4 oz (49.6 kg)   LMP 06/12/2018 (Exact Date)   BMI 19.98 kg/m  Filed  Weights   06/24/18 1450  Weight: 109 lb 4 oz (49.6 kg)   Constitutional:  Well-developed, in no acute distress. Psychiatric: Normal mood and affect. Behavior is normal. HEENT: Pupils normal.  Conjunctivae are normal. No scleral icterus. Neck supple.  Cardiovascular: Normal rate, regular rhythm. No edema Pulmonary/chest: Effort normal and breath sounds normal. No wheezing, rales or rhonchi. Abdominal: Soft, nondistended. Nontender. Bowel sounds active throughout. There are no masses palpable. No hepatomegaly. Healing surgical scars. Rectal:  defered Neurological: Alert and oriented to person place and time. Skin: Skin is warm and dry. No rashes noted.  Data Reviewed: I have personally reviewed following labs and imaging studies  CBC: CBC Latest Ref Rng & Units 06/18/2018 06/10/2018 09/07/2011  WBC 4.0 - 10.5 K/uL 9.8 8.4 11.7(H)  Hemoglobin 12.0 - 15.0 g/dL 9.1(L) 11.5(L) 10.1(L)  Hematocrit 36.0 - 46.0 % 27.7(L) 35.8(L) 31.8(L)  Platelets 150 - 400 K/uL 183 257 353    CMP: CMP Latest Ref Rng & Units 06/18/2018 06/10/2018 04/14/2008  Glucose 70 - 99 mg/dL 112(H) 93 92  BUN 6 - 20 mg/dL 10 11 12   Creatinine 0.44 - 1.00 mg/dL 0.50 0.49 0.54  Sodium 135 - 145 mmol/L 137 136 136  Potassium 3.5 - 5.1 mmol/L 3.2(L) 3.9 3.9  Chloride 98 - 111 mmol/L 105 102 103  CO2 22 - 32 mmol/L 25 25 27   Calcium 8.9 - 10.3 mg/dL 8.0(L) 8.7(L) 9.0  Total Protein 6.5 - 8.1 g/dL 5.5(L) 7.0 -  Total Bilirubin 0.3 - 1.2 mg/dL 0.3 0.7 -  Alkaline Phos 38 - 126 U/L 28(L) 38 -  AST 15 - 41 U/L 19 21 -  ALT 0 - 44 U/L 13 22 -    GFR: Estimated Creatinine Clearance: 73.9 mL/min (by C-G formula based on SCr of 0.5 mg/dL). Liver Function Tests: Recent Labs  Lab 06/18/18 0608  AST 19  ALT 13  ALKPHOS 28*  BILITOT 0.3  PROT 5.5*  ALBUMIN 2.9*     Carmell Austria, MD 06/24/2018, 3:15 PM  Cc: Dr Precious Haws

## 2018-06-24 NOTE — Patient Instructions (Signed)
If you are age 39 or older, your body mass index should be between 23-30. Your Body mass index is 19.98 kg/m. If this is out of the aforementioned range listed, please consider follow up with your Primary Care Provider.  If you are age 22 or younger, your body mass index should be between 19-25. Your Body mass index is 19.98 kg/m. If this is out of the aformentioned range listed, please consider follow up with your Primary Care Provider.   You have been scheduled for an endoscopy and colonoscopy. Please follow the written instructions given to you at your visit today. Please pick up your prep supplies at the pharmacy within the next 1-3 days. If you use inhalers (even only as needed), please bring them with you on the day of your procedure. Your physician has requested that you go to www.startemmi.com and enter the access code given to you at your visit today. This web site gives a general overview about your procedure. However, you should still follow specific instructions given to you by our office regarding your preparation for the procedure.  Thank you,  Dr. Jackquline Denmark

## 2018-07-13 ENCOUNTER — Other Ambulatory Visit: Payer: Self-pay | Admitting: Oncology

## 2018-07-13 NOTE — Progress Notes (Signed)
Gynecologic Oncology Multi-Disciplinary Disposition Conference Note  Date of the Conference: 07/13/2018  Patient Name: Breanna Jenkins  Referring Provider: Primary GYN Oncologist:  Stage/Disposition:  Stace IC1. Disposition is to close observation along with tumor markers. Consider genetics referral.     This Multidisciplinary conference took place involving physicians from Gynecologic Oncology, Medical Oncology, Radiation Oncology, Pathology, Radiology along with the Gynecologic Oncology Nurse Practitioner and RN.  Comprehensive assessment of the patient's malignancy, staging, need for surgery, chemotherapy, radiation therapy, and need for further testing were reviewed. Supportive measures, both inpatient and following discharge were also discussed. The recommended plan of care is documented. Greater than 35 minutes were spent correlating and coordinating this patient's care.

## 2018-07-15 NOTE — Progress Notes (Deleted)
[place] at [facility]   Progress Note: Established Patient Follow-Up Visit  Consult was requested by Dr. Linda Hedges for an incidental ovarian mucinous borderline tumor with foci of intraepithelial carcinoma   No chief complaint on file.  GYN ONCOLOGIC SUMMARY 1. 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 Lynnette Caffey 05/2018 (prior h/o LSO for dermoid)  HPI: [name]  is a very nice 37 y.o.  P1  . Interval History Since her last visit I presented her case at Tumor Board. Observation reasonable. Genetics was suggested.    . Oncologic Course  Approximately 1 month prior to presentation the patient noticed significant pain in the right upper quadrant after she ate dinner.  She states this radiated to her back and was accompanied by nausea and vomiting.  Outside of the symptoms she has had no other issues.  She denies weight loss denies early satiety denies appetite changes denies bloating denies pelvic pain.  She presented with these complaints her primary care provider who ordered CT imaging thinking this might be a kidney stone and she was incidentally found to have a right adnexal mass.  She was then referred on to GYN.  Ca1 25 on May 21, 2018 was 21  On 06/17/18 Dr. Lynnette Caffey took the patient for a laparotomy with RSO,washings, and frozen pathology. The frozen section revealed a benign mucinous neoplasm however the final pathology was as follows: - MUCINOUS BORDERLINE TUMOR WITH FOCAL INTRAEPITHELIAL CARCINOMA, 15 CM. - TUMOR IS LIMITED TO OVARY. - BENIGN FALLOPIAN TUBE. - SEE ONCOLOGY TABLE. Microscopic Comment OVARY or FALLOPIAN TUBE or PRIMARY PERITONEUM: Procedure: Right salpingo-oophorectomy. Specimen Integrity: Intact. However intraoperative leak was noted. Tumor Site: Right ovary. Ovarian Surface Involvement (required only if applicable): Not  identified. Fallopian Tube Surface Involvement (required only if applicable): Not identified. Tumor Size: 15 cm. Histologic Type: Mucinous borderline tumor with intraepithelial carcinoma. Histologic Grade: Implants (required for advanced stage serous/seromucinous borderline tumors only): N/A. Other Tissue/ Organ Involvement: None. Largest Extrapelvic Peritoneal Focus (required only if applicable): N/A. Peritoneal/Ascitic Fluid: Negative. Treatment Effect (required only for high-grade serous carcinomas): N/A. Regional Lymph Nodes: No lymph nodes submitted or found Pathologic Stage Classification (pTNM, AJCC 8th Edition): pT1a, pNX Washings were negative  Therefore she is at least a Stage IC1 (due to surgical spill)  She presents for management recommendations.  She has no complaints today.  Surgery was approximately 6 days ago.   Imported EPIC Oncologic History:   No history exists.    ECOG PERFORMANCE STATUS: 0 - Asymptomatic  Measurement of disease:  Plan will be CEA, CA19-9, and CA125,  . Ca1 25 o 05/21/2018 - 21 . CEA  o 06/23/18 = 1.24 . CA19-9  o 06/23/18 = 8  Radiology: . 05/20/2018 - CT renal study - reveals the 10cm complex right ovarian mass. NED otherwise, specifically no enlarged lymph nodes.  Outpatient Encounter Medications as of 07/24/2018  Medication Sig  . ibuprofen (ADVIL,MOTRIN) 600 MG tablet Take 1 tablet (600 mg total) by mouth every 6 (six) hours as needed.  Marland Kitchen oxyCODONE-acetaminophen (PERCOCET/ROXICET) 5-325 MG tablet Take 1 tablet by mouth every 4 (four) hours as needed for moderate pain ((when tolerating fluids)). (Patient not taking: Reported on 06/23/2018)   No facility-administered encounter medications on file as of 07/24/2018.    No Known Allergies  Past Medical History:  Diagnosis Date  . No pertinent past medical history  Past Surgical History:  Procedure Laterality Date  . CESAREAN SECTION  08/29/2011   Procedure: CESAREAN SECTION;  Surgeon:  Cyril Mourning, MD;  Location: [facility];  Service: Gynecology;  Laterality: N/A;  . LAPAROTOMY Right 06/17/2018   Laparotomy with Right Salpingo ophorectomy;  Surgeon: Linda Hedges, DO;  Location: [facility];  Service: Gynecology;  Laterality: Right;  removal of right adnexal mass with possible BSO  . LASIK    . 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 SALPINGOOPHORECTOMY Right 06/17/2018   with laparotomy above 05/2018        Past Gynecological History:   GYNECOLOGIC HISTORY:  . No LMP recorded.  . Menarche: 37 years old . P 1 . Contraceptive none . HRT none  . Last Pap 01/2018 on record neg with neg HRHPV Family Hx:  Family History  Problem Relation Age of Onset  . Colon cancer Neg Hx   . Esophageal cancer Neg Hx   . Pancreatic cancer Neg Hx   . Diabetes Neg Hx    Social Hx:  Marland Kitchen Tobacco use: none . Alcohol use: none . Illicit Drug use: none . Illicit IV Drug use: none    Review of Systems: Review of Systems  Constitutional: Negative.   HENT:  Negative.   Eyes: Negative.   Respiratory: Negative.   Cardiovascular: Negative.   Gastrointestinal: Negative.   Endocrine: Negative.   Genitourinary: Negative.    Musculoskeletal: Negative.   Skin: Negative.   Neurological: Negative.   Hematological: Negative.   Psychiatric/Behavioral: Negative.    Vitals: Last menstrual period 06/12/2018, currently breastfeeding. There were no vitals filed for this visit.  There were no vitals filed for this visit. There is no height or weight on file to calculate BMI.   Physical Exam: General :  Well developed, 37 y.o., female in no apparent distress HEENT:  Normocephalic/atraumatic, symmetric, EOMI, eyelids normal Neck:   Supple, no masses.  Lymphatics:  No cervical/ submandibular/ supraclavicular/ infraclavicular/ inguinal adenopathy Respiratory:  Respirations unlabored, no use of accessory muscles CV:   Deferred Breast:  Deferred Musculoskeletal: No CVA  tenderness, normal muscle strength. Abdomen:  Transverse Soft, non-tender and nondistended. No evidence of hernia. No masses. Extremities:  No lymphedema, no erythema, non-tender. Skin:   Normal inspection Neuro/Psych:  No focal motor deficit, no abnormal mental status. Normal gait. Normal affect. Alert and oriented to person, place, and time  Genito Urinary: Vulva: ***Normal external female genitalia.  Bladder/urethra: Urethral meatus normal in size and location. No lesions or   masses, well supported bladder ***Speculum exam: Vagina: ***No lesion, no discharge, no bleeding. Cervix: ***Normal appearing, no lesions. Bimanual exam: *** Uterus: ***Normal size, mobile.  Adnexa: ***No masses. Rectovaginal:  ***Good tone, no masses, no cul de sac nodularity, no parametrial involvement or nodularity.    Assessment  Mucinous borderline ovarian tumor  Plan  1. Data reviewed ? Last visit we reviewed her operative and pathology reports from her recent surgery ? We reviewed her pathology report from 2009 showing LSO for dermoid. ? Recall she had a normal preoperative tumor marker and her preoperative CTScan 2. Management discussion ? She is technically a borderline tumor ? Although there was intraoperative spill I believe she is low risk for recurrence. ? I do not feel additional surgery, staging, or hysterectomy is indicated. ? Her contralateral ovary is already gone. ? *** ? She should be seen by GI for consideration of lower and upper endoscopy ? *** ? Her pap  is noted 01/2018 negative and negative HRHPV so endocervix likely cleared 3. RTC April for pelvic and tumor markers. ? Followup Dr. Lynnette Caffey as scheduled and then annually.  Isabel Caprice, MD  07/15/2018, 2:28 PM    Cc: Linda Hedges, DO (Referring Ob/Gyn) Patient, No Pcp Per (PCP)

## 2018-07-24 ENCOUNTER — Telehealth: Payer: Self-pay

## 2018-07-24 ENCOUNTER — Inpatient Hospital Stay: Payer: Managed Care, Other (non HMO) | Attending: Obstetrics | Admitting: Obstetrics

## 2018-07-24 DIAGNOSIS — Z90722 Acquired absence of ovaries, bilateral: Secondary | ICD-10-CM | POA: Insufficient documentation

## 2018-07-24 DIAGNOSIS — D3911 Neoplasm of uncertain behavior of right ovary: Secondary | ICD-10-CM | POA: Insufficient documentation

## 2018-07-24 NOTE — Telephone Encounter (Signed)
Husband Mr Breanna Jenkins stated that his wife must have forgotten her appointment.  He will have her to call the office on Monday 07-27-18 to reschedule appointment with Dr. Gerarda Fraction. Called husband as [name]'s VM not set up.

## 2018-07-30 NOTE — Progress Notes (Signed)
[place] at [facility] Note: Established Patient Follow-Up Visit  Consult was initially requested by Dr. 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 Ovarian tumor of borderline malignancy, unspecified laterali    ONCOLOGIC SUMMARY 1. 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 Lynnette Caffey 05/2018 (remote h/o LSO 2009)  HPI: Ms. Eleana Tocco  is a very nice 37 y.o.  P1 . Interval History Since her last visit we presented her case at Multidisciplinary Tumor Conference. Consensus opinion was to consider her Stace IC1. Disposition is to close observation along with tumor markers. Consider genetics referral.  She returns today to discuss these recommendations and for pelvic as she was so early postop that was deferred on her first visit with me.  Additional tumor markers obtained at the last visit were normal.  . Oncologic Course  Approximately 1 month prior to presentation the patient noticed significant pain in the right upper quadrant after she ate dinner.  She states this radiated to her back and was accompanied by nausea and vomiting.  Outside of the symptoms she has had no other issues.  She denies weight loss denies early satiety denies appetite changes denies bloating denies pelvic pain.  She presented with these complaints her primary care provider who ordered CT imaging thinking this might be a kidney stone and she was incidentally found to have a right adnexal mass.  She was then referred on to GYN.  Ca1 25 on May 21, 2018 was 21  On 06/17/18 Dr. Lynnette Caffey took the patient for a laparotomy with RSO,washings, and frozen pathology. The frozen section revealed a benign mucinous neoplasm however the final pathology was as follows: - MUCINOUS BORDERLINE TUMOR  WITH FOCAL INTRAEPITHELIAL CARCINOMA, 15 CM. - TUMOR IS LIMITED TO OVARY. - BENIGN FALLOPIAN TUBE. - SEE ONCOLOGY TABLE. Microscopic Comment OVARY or FALLOPIAN TUBE or PRIMARY PERITONEUM: Procedure: Right salpingo-oophorectomy. Specimen Integrity: Intact. However intraoperative leak was noted. Tumor Site: Right ovary. Ovarian Surface Involvement (required only if applicable): Not identified. Fallopian Tube Surface Involvement (required only if applicable): Not identified. Tumor Size: 15 cm. Histologic Type: Mucinous borderline tumor with intraepithelial carcinoma. Histologic Grade: Implants (required for advanced stage serous/seromucinous borderline tumors only): N/A. Other Tissue/ Organ Involvement: None. Largest Extrapelvic Peritoneal Focus (required only if applicable): N/A. Peritoneal/Ascitic Fluid: Negative. Treatment Effect (required only for high-grade serous carcinomas): N/A. Regional Lymph Nodes: No lymph nodes submitted or found Pathologic Stage Classification (pTNM, AJCC 8th Edition): pT1a, pNX Washings were negative  Therefore she is at least a Stage IC1 (due to surgical spill)  She presents for management recommendations.  She has no complaints today.  Surgery was approximately 6 days ago.   Imported EPIC Oncologic History:   No history exists.    Measurement of disease: Exam (will follow tumor markers, but not known MOD)  . Ca1 25 o 05/21/2018 - 21 (preop) . CEA o 06/24/18 = 1.24 (postop) . CA19-9 o 06/24/18 = 8 (postop)   Radiology: . 05/20/2018 - CT AP  study - reveals the 10cm complex right ovarian mass. NED otherwise, specifically no enlarged lymph nodes.  Outpatient Encounter Medications as of 08/04/2018  Medication Sig  . [DISCONTINUED] ibuprofen (ADVIL,MOTRIN) 600 MG tablet Take 1 tablet (600 mg total) by mouth every 6 (six) hours as needed. (Patient not taking:  Reported on 08/04/2018)  . [DISCONTINUED] oxyCODONE-acetaminophen (PERCOCET/ROXICET) 5-325  MG tablet Take 1 tablet by mouth every 4 (four) hours as needed for moderate pain ((when tolerating fluids)). (Patient not taking: Reported on 06/23/2018)   No facility-administered encounter medications on file as of 08/04/2018.    No Known Allergies  Past Medical History:  Diagnosis Date  . Ovarian tumor of borderline malignancy, right    Past Surgical History:  Procedure Laterality Date  . CESAREAN SECTION  08/29/2011   Procedure: CESAREAN SECTION;  Surgeon: Cyril Mourning, MD;  Location: [facility];  Service: Gynecology;  Laterality: N/A;  . LAPAROTOMY Right 06/17/2018   Laparotomy with Right Salpingo ophorectomy;  Surgeon: Linda Hedges, DO;  Location: [facility];  Service: Gynecology;  Laterality: Right;  removal of right adnexal mass with possible BSO  . LASIK    . 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 SALPINGOOPHORECTOMY Right 06/17/2018   with laparotomy above 05/2018        Past Gynecological History:   GYNECOLOGIC HISTORY:  . No LMP recorded.  . Menarche: 37 years old . P 1 . Contraceptive none . HRT none  . Last Pap 01/2018 on record neg with neg HRHPV Family Hx:  Family History  Problem Relation Age of Onset  . Colon cancer Neg Hx   . Esophageal cancer Neg Hx   . Pancreatic cancer Neg Hx   . Diabetes Neg Hx    Social Hx:  Marland Kitchen Tobacco use: none . Alcohol use: none . Illicit Drug use: none . Illicit IV Drug use: none    Review of Systems: Review of Systems  Constitutional: Negative.   HENT:  Negative.   Eyes: Negative.   Respiratory: Negative.   Cardiovascular: Negative.   Gastrointestinal: Negative.   Endocrine: Negative.   Genitourinary: Negative.    Musculoskeletal: Negative.   Skin: Negative.   Neurological: Negative.   Hematological: Negative.   Psychiatric/Behavioral: Negative.    Vitals: Last menstrual period 06/12/2018, currently breastfeeding. Vitals:   08/04/18 1001  Weight: 110 lb 8 oz (50.1 kg)  Height: 5\' 2"  (1.575  m)    Vitals:   08/04/18 1001  BP: 95/64  Pulse: 77  Resp: 20  Temp: 98.5 F (36.9 C)  SpO2: 100%   Body mass index is 20.21 kg/m.   Physical Exam:  ECOG PERFORMANCE STATUS: 0 - Asymptomatic  General :  Well developed, 37 y.o., female in no apparent distress HEENT:  Normocephalic/atraumatic, symmetric, EOMI, eyelids normal Neck:   Supple, no masses.  Lymphatics:  No cervical/ submandibular/ supraclavicular/ infraclavicular/ inguinal adenopathy Respiratory:  Respirations unlabored, no use of accessory muscles CV:   Deferred Breast:  Deferred Musculoskeletal: No CVA tenderness, normal muscle strength. Abdomen:  Incision is healed. Soft, non-tender and nondistended. No evidence of hernia. No masses. Extremities:  No lymphedema, no erythema, non-tender. Skin:   Normal inspection Neuro/Psych:  No focal motor deficit, no abnormal mental status. Normal gait. Normal affect. Alert and oriented to person, place, and time  Genito Urinary: Vulva: Normal external female genitalia.  Bladder/urethra: Urethral meatus normal in size and location. No lesions or   masses, well supported bladder Speculum exam: Vagina: No lesion, no discharge, no bleeding. Cervix: Normal appearing, no lesions. Bimanual exam:  Uterus: Normal size, mobile.  Adnexa: (surgically absent) No masses. Rectovaginal:  Good tone, no masses, no cul de sac nodularity, no parametrial involvement or nodularity.   Assessment  Mucinous borderline ovarian tumor  Plan  1.  Data reviewed ? We reviewed the tumor board recommendations 2. Management discussion ? Although there was intraoperative spill I believe she is low risk for recurrence. ? I do not feel additional surgery, staging, or hysterectomy is indicated. ? Multidisciplinary tumor board agrees. ? I favor GI consultation for lower and upper endoscopy ? She reports to have appointments for such the end of this month. ? I will also obtain a CXR to complete her  staging 3. We discussed genetics usually not indicated for borderline / LMP tumors, however given the foci of intraepithelial carcinoma it was suggested at Tumor conference  ? I offered that referral today and she would like to proceed  4. RTC Q 6 months (March 2020) when next set of tumor markers due. ? Plan for tumor markers each visit ? Keep annual exams/followup with Dr. Lynnette Caffey.  Face to face time with patient was 15 minutes. Over 50% of this time was spent on counseling and coordination of care.   Isabel Caprice, MD  08/04/2018, 10:17 AM    Cc: Linda Hedges, DO (Referring Ob/Gyn) Patient, No Pcp Per (PCP)

## 2018-08-04 ENCOUNTER — Encounter: Payer: Self-pay | Admitting: Obstetrics

## 2018-08-04 ENCOUNTER — Inpatient Hospital Stay (HOSPITAL_BASED_OUTPATIENT_CLINIC_OR_DEPARTMENT_OTHER): Payer: Managed Care, Other (non HMO) | Admitting: Obstetrics

## 2018-08-04 ENCOUNTER — Encounter: Payer: Self-pay | Admitting: Gastroenterology

## 2018-08-04 ENCOUNTER — Ambulatory Visit (HOSPITAL_COMMUNITY)
Admission: RE | Admit: 2018-08-04 | Discharge: 2018-08-04 | Disposition: A | Discharge status: Home / Self Care | Payer: Managed Care, Other (non HMO) | Source: Ambulatory Visit | Attending: Obstetrics | Admitting: Obstetrics

## 2018-08-04 VITALS — BP 95/64 | HR 77 | Temp 98.5°F | Resp 20 | Ht 62.0 in | Wt 110.5 lb

## 2018-08-04 DIAGNOSIS — D3911 Neoplasm of uncertain behavior of right ovary: Secondary | ICD-10-CM | POA: Diagnosis present

## 2018-08-04 DIAGNOSIS — D3910 Neoplasm of uncertain behavior of unspecified ovary: Secondary | ICD-10-CM | POA: Diagnosis not present

## 2018-08-04 DIAGNOSIS — Z90722 Acquired absence of ovaries, bilateral: Secondary | ICD-10-CM

## 2018-08-04 NOTE — Patient Instructions (Signed)
Return in March for visit with Dr. Gerarda Fraction at [facility] for upper and lower endoscopy with gastroenterology Chest xray will be ordered Genetic counseling referral will be made

## 2018-08-06 ENCOUNTER — Telehealth: Payer: Self-pay

## 2018-08-06 NOTE — Telephone Encounter (Signed)
Told Breanna Jenkins that her Chest X-ray was normal per Joylene John, NP.

## 2018-08-18 ENCOUNTER — Encounter: Payer: Managed Care, Other (non HMO) | Admitting: Gastroenterology

## 2018-08-24 ENCOUNTER — Encounter: Payer: Managed Care, Other (non HMO) | Admitting: Licensed Clinical Social Worker

## 2018-08-24 ENCOUNTER — Other Ambulatory Visit: Payer: Managed Care, Other (non HMO)

## 2018-11-02 IMAGING — CR DG CHEST 2V
2 series · 2 of 2 positions shown · non-contrast
Comparison: None.

CLINICAL DATA: Staging ovarian cancer.

EXAM:
CHEST - 2 VIEW

[w chest pa]
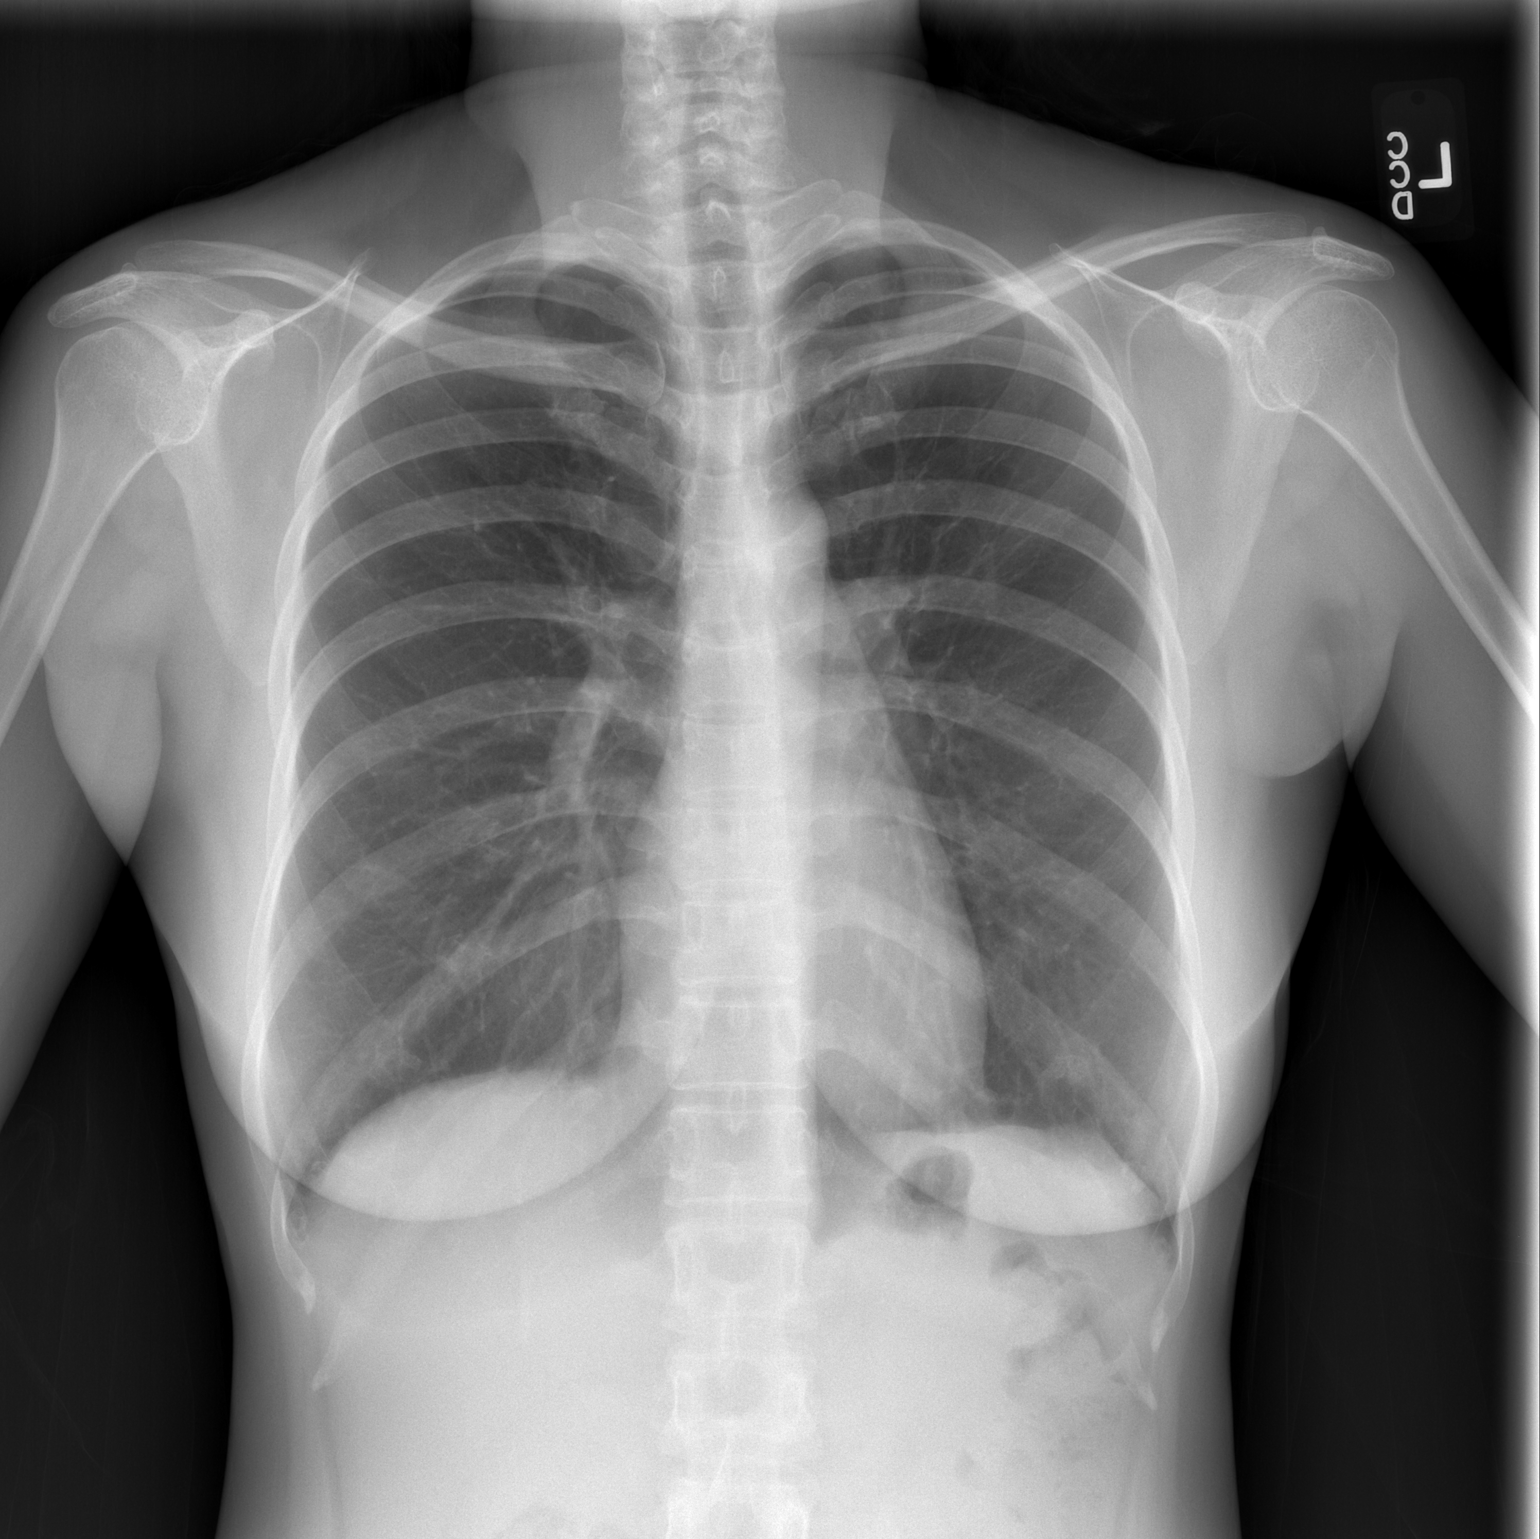

[w chest lat]
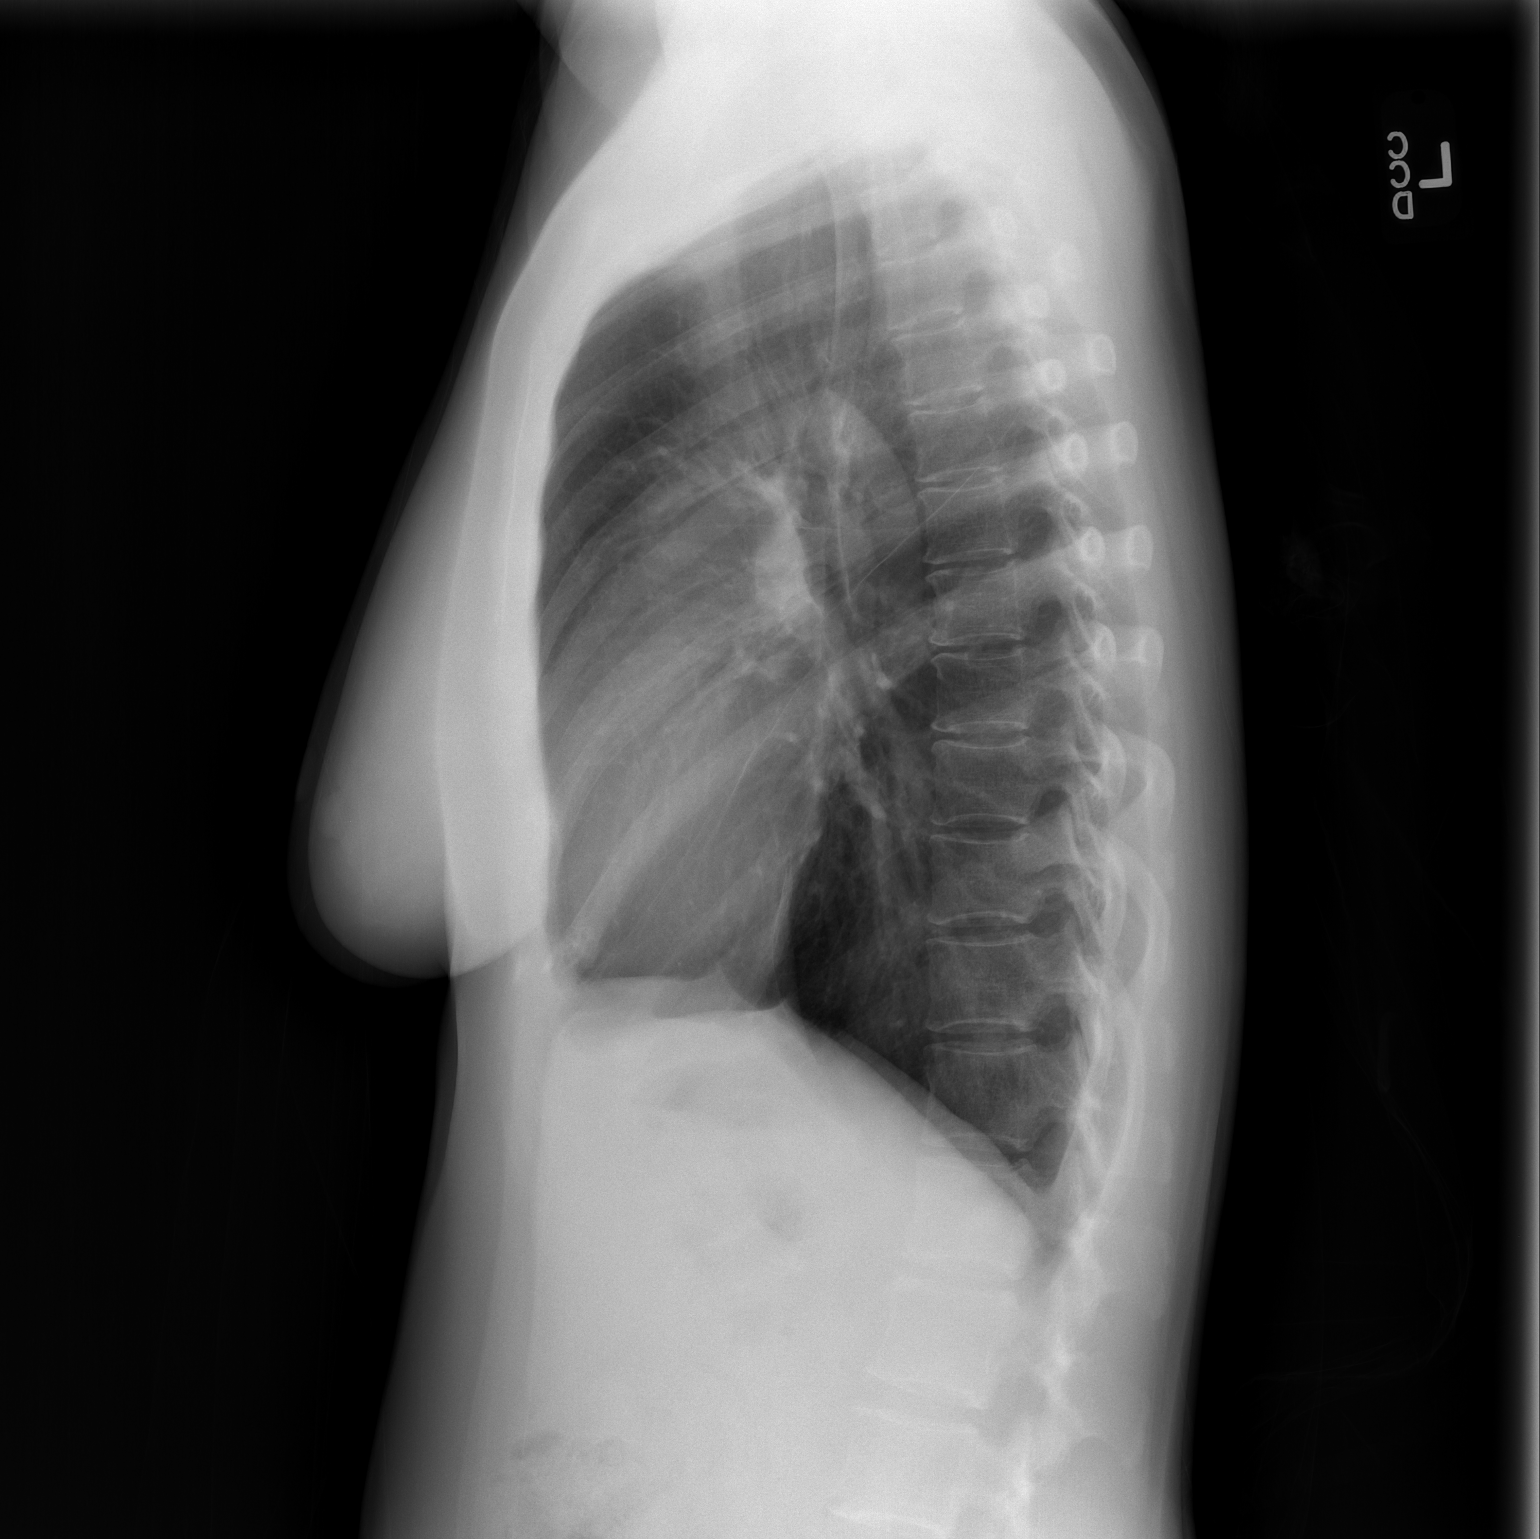

[2 of 2 positions shown; findings below may reference images not displayed]

FINDINGS: The heart size and mediastinal contours are within normal limits.
Both lungs are clear. The visualized skeletal structures are
unremarkable.
IMPRESSION: No active cardiopulmonary disease.

## 2019-01-07 ENCOUNTER — Telehealth: Payer: Self-pay | Admitting: *Deleted

## 2019-01-07 NOTE — Telephone Encounter (Signed)
Fax office notes to Dr. Lynnette Caffey office per request

## 2020-04-25 ENCOUNTER — Other Ambulatory Visit: Payer: Self-pay | Admitting: Obstetrics & Gynecology

## 2020-04-25 DIAGNOSIS — R928 Other abnormal and inconclusive findings on diagnostic imaging of breast: Secondary | ICD-10-CM

## 2020-05-03 ENCOUNTER — Other Ambulatory Visit: Payer: Managed Care, Other (non HMO)

## 2020-05-15 ENCOUNTER — Ambulatory Visit: Payer: Self-pay

## 2020-05-15 ENCOUNTER — Ambulatory Visit
Admission: RE | Admit: 2020-05-15 | Discharge: 2020-05-15 | Disposition: A | Discharge status: Home / Self Care | Payer: 59 | Source: Ambulatory Visit | Attending: Obstetrics & Gynecology | Admitting: Obstetrics & Gynecology

## 2020-05-15 ENCOUNTER — Other Ambulatory Visit: Payer: Self-pay

## 2020-05-15 DIAGNOSIS — R928 Other abnormal and inconclusive findings on diagnostic imaging of breast: Secondary | ICD-10-CM

## 2020-07-03 ENCOUNTER — Other Ambulatory Visit (HOSPITAL_COMMUNITY): Payer: Self-pay | Admitting: Urology

## 2020-07-03 DIAGNOSIS — R3129 Other microscopic hematuria: Secondary | ICD-10-CM

## 2020-07-06 ENCOUNTER — Other Ambulatory Visit: Payer: Self-pay

## 2020-07-06 ENCOUNTER — Ambulatory Visit (HOSPITAL_COMMUNITY)
Admission: RE | Admit: 2020-07-06 | Discharge: 2020-07-06 | Disposition: A | Discharge status: Home / Self Care | Payer: Self-pay | Source: Ambulatory Visit | Attending: Urology | Admitting: Urology

## 2020-07-06 DIAGNOSIS — R3129 Other microscopic hematuria: Secondary | ICD-10-CM | POA: Insufficient documentation

## 2020-08-13 IMAGING — MG MM DIGITAL DIAGNOSTIC UNILAT*L* W/ TOMO W/ CAD
4 series · 4 of 12 positions shown · non-contrast
Comparison: Previous exam(s).

CLINICAL DATA: Screening recall for a possible left breast mass.

EXAM:
DIGITAL DIAGNOSTIC UNILATERAL LEFT MAMMOGRAM WITH TOMO AND CAD

[L CC synth-2D]
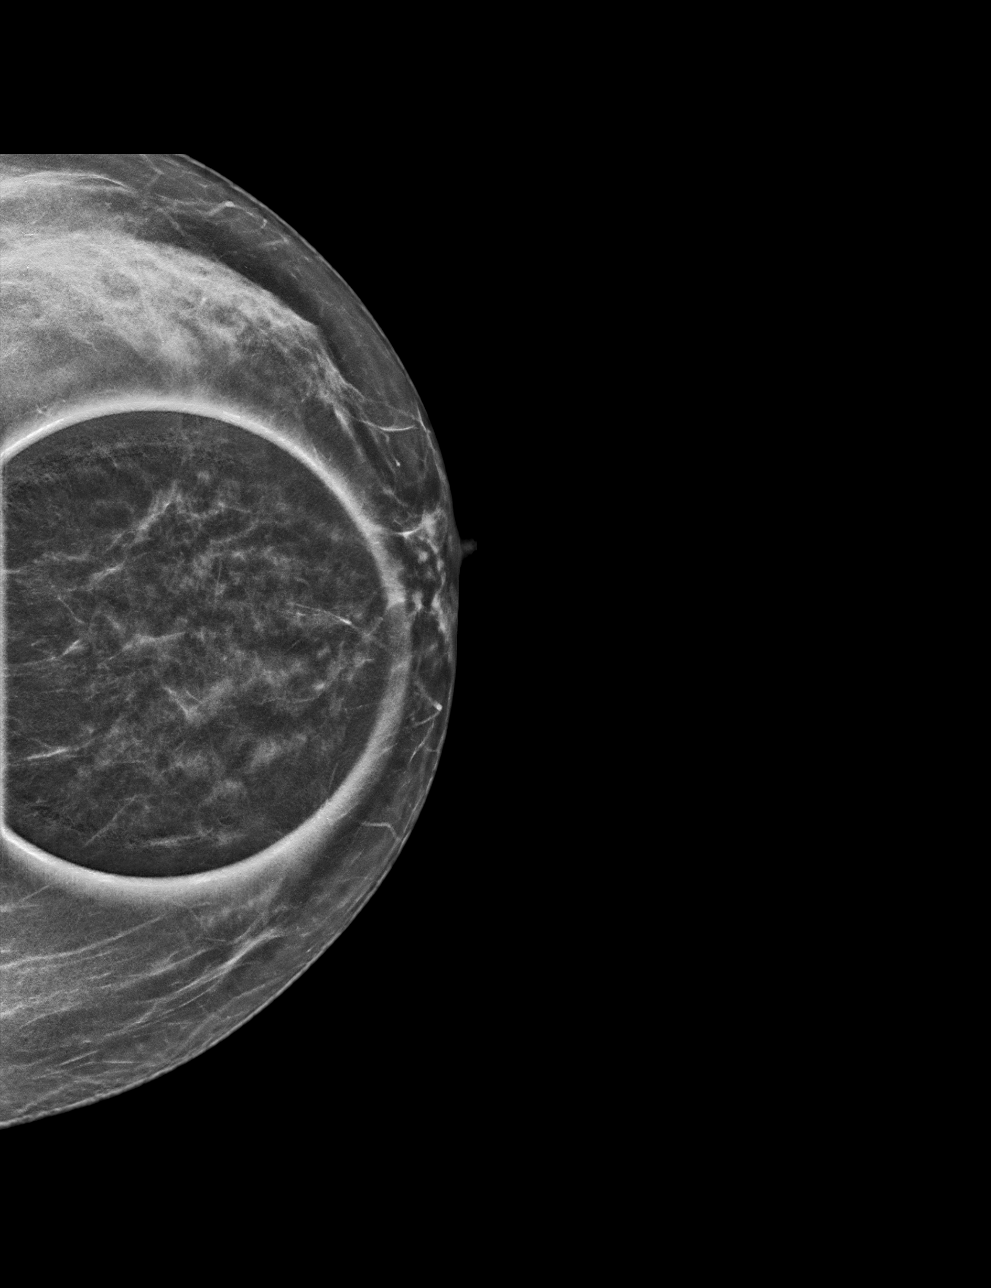

[L MLO synth-2D]
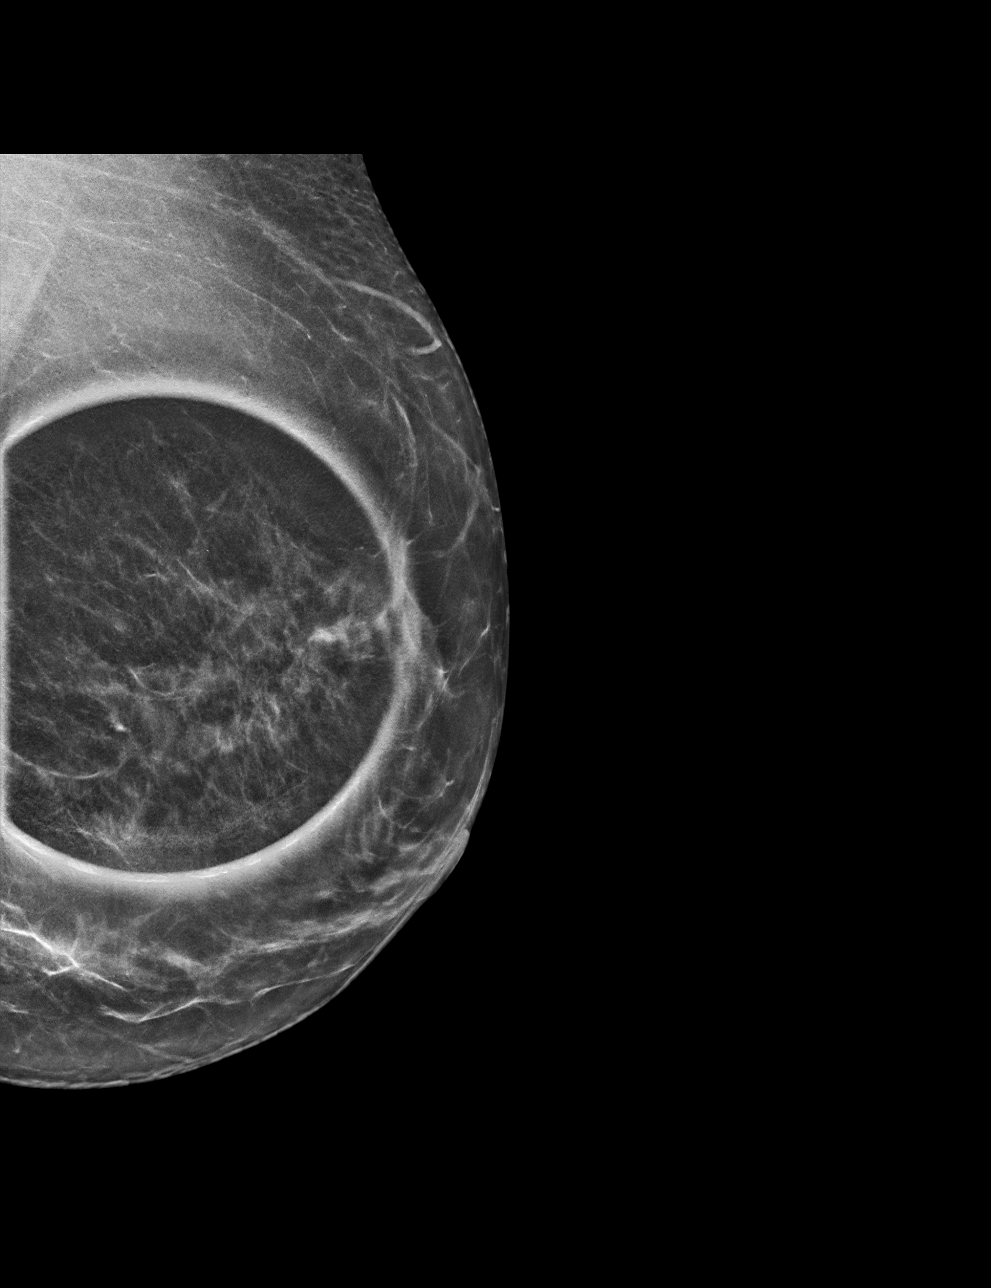

[L MLO tomo · tomo slice 31/61.0]
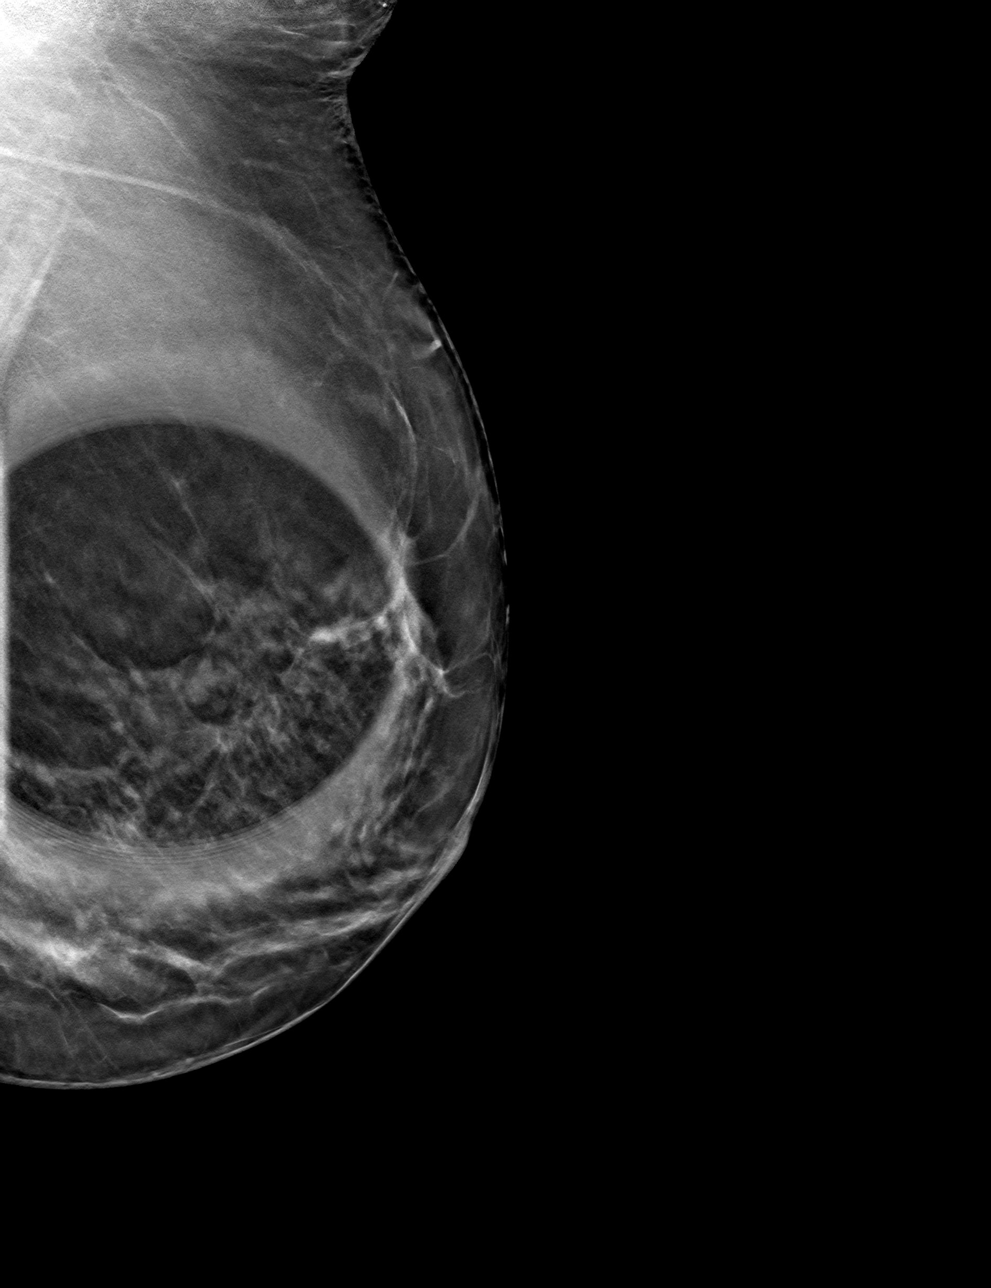

[L CC tomo · tomo slice 25/50.0]
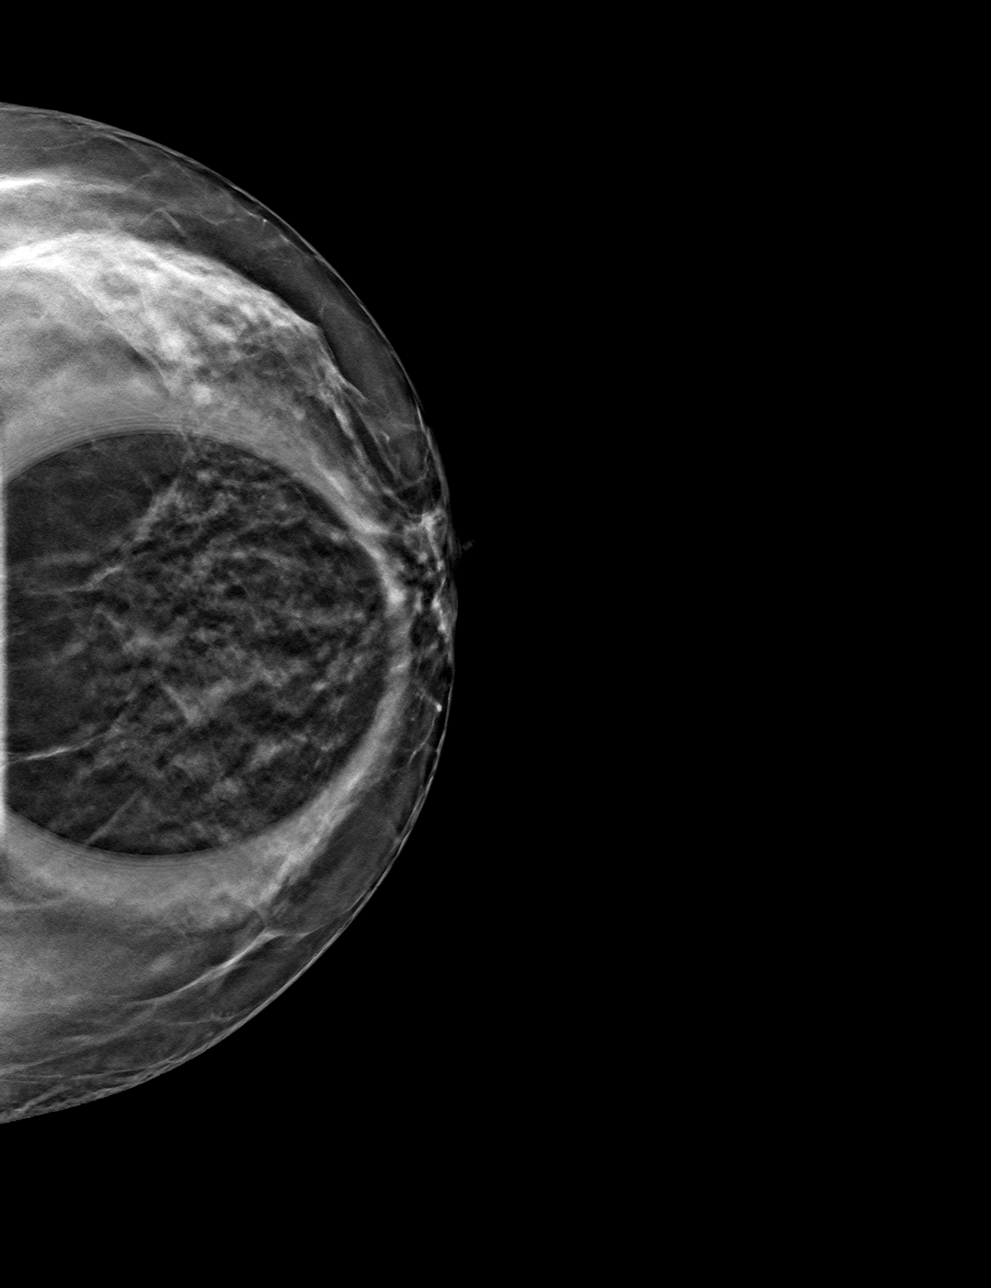

[4 of 12 positions shown; findings below may reference images not displayed]

ACR Breast Density Category c: The breast tissue is heterogeneously
dense, which may obscure small masses.
FINDINGS: The possible mass, which was noted predominantly on the screening cc
view, disperses with spot compression imaging consistent with
superimposed normal fibroglandular tissue. No mass or significant
asymmetry is noted on the diagnostic MLO images. There are no areas
of architectural distortion and there are no suspicious
calcifications.

Mammographic images were processed with CAD.
IMPRESSION: Negative exam.  No evidence of breast malignancy.

RECOMMENDATION:
Screening mammogram in one year.(Code:[id number])

I have discussed the findings and recommendations with the patient.
If applicable, a reminder letter will be sent to the patient
regarding the next appointment.

BI-RADS CATEGORY  1: Negative.

## 2020-10-04 IMAGING — US US RENAL
1 series · 14 of 25 positions shown · non-contrast
Comparison: CT, 05/20/2018.

CLINICAL DATA: Microscopic hematuria.

EXAM:
RENAL / URINARY TRACT ULTRASOUND COMPLETE

[Series 1: us renal · 14 of 32 slices shown]
[im 1/32]
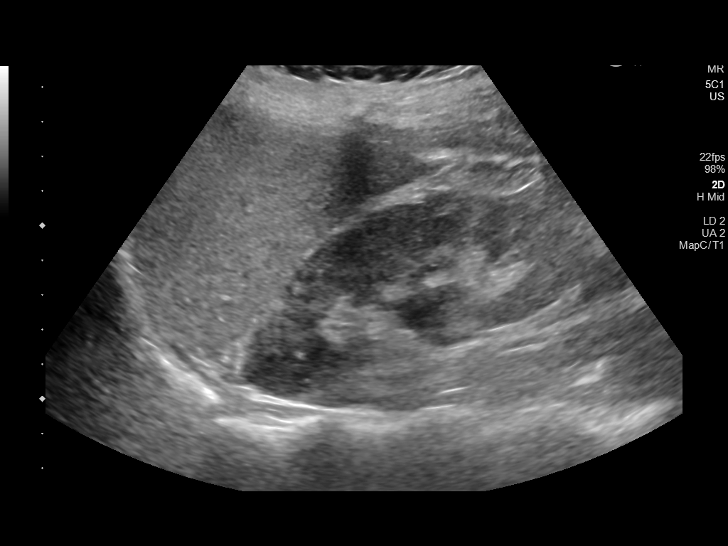
[im 3/32]
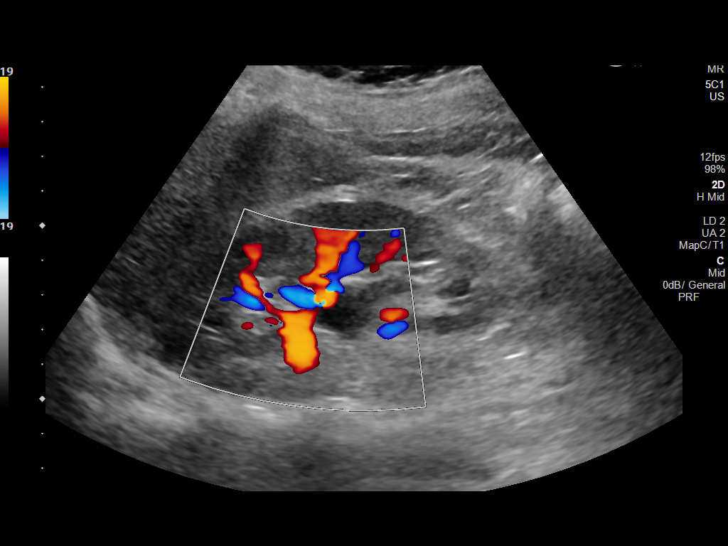
[im 6/32]
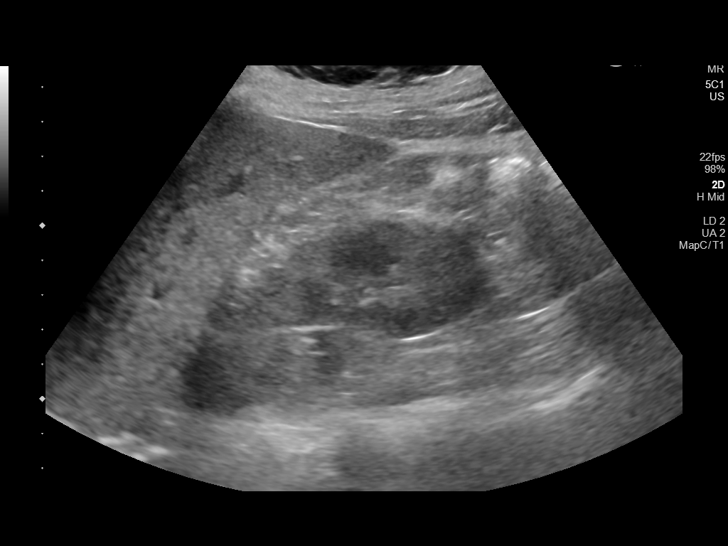
[im 8/32]
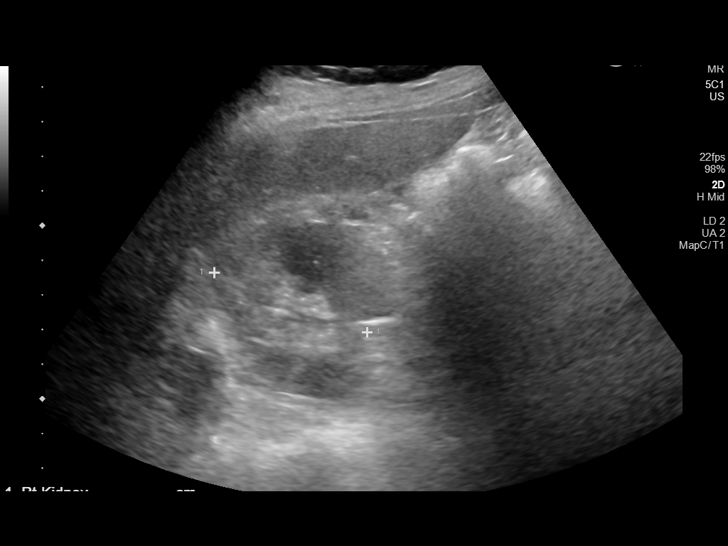
[im 11/32]
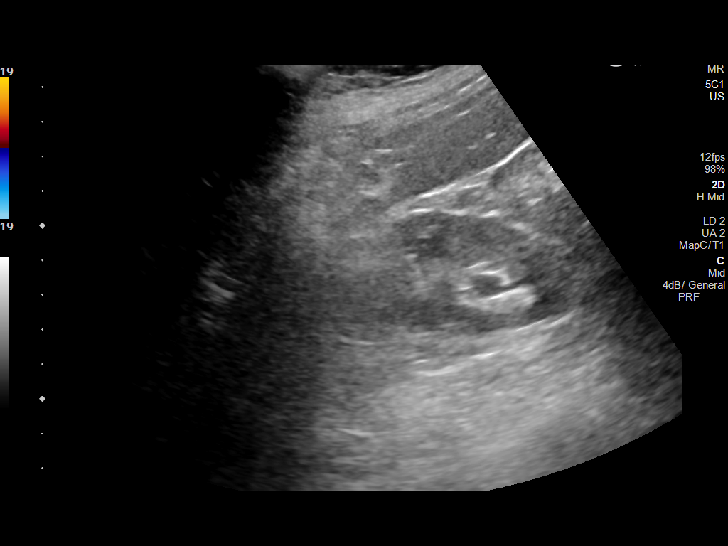
[im 12/32]
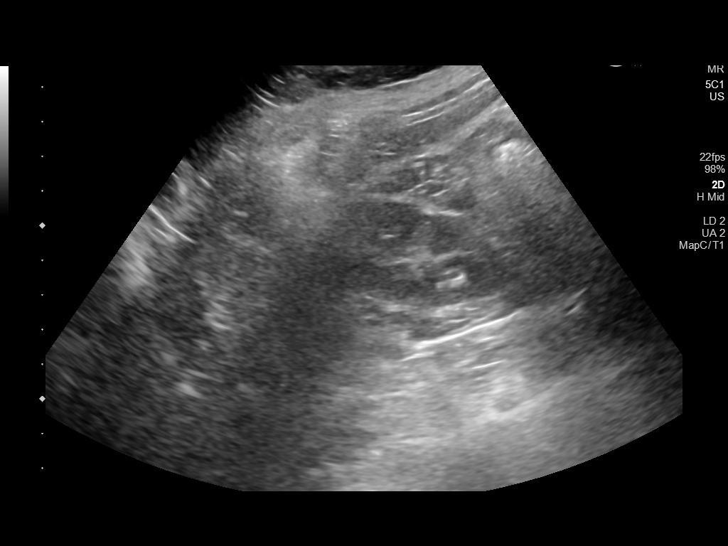
[im 15/32]
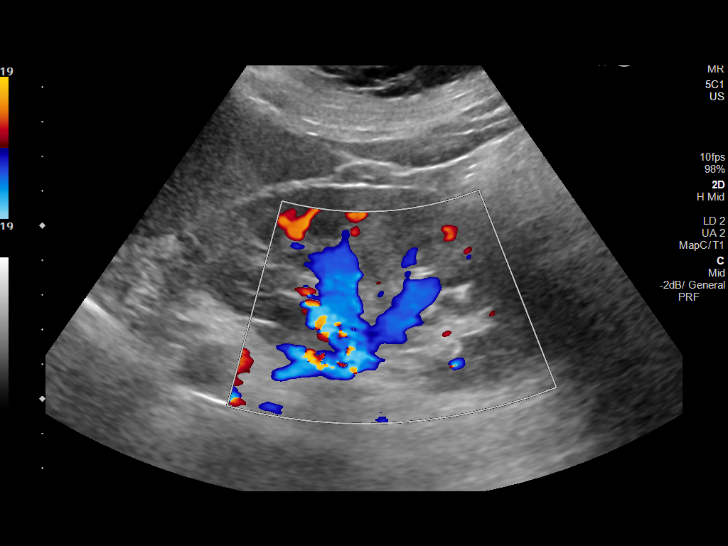
[im 17/32]
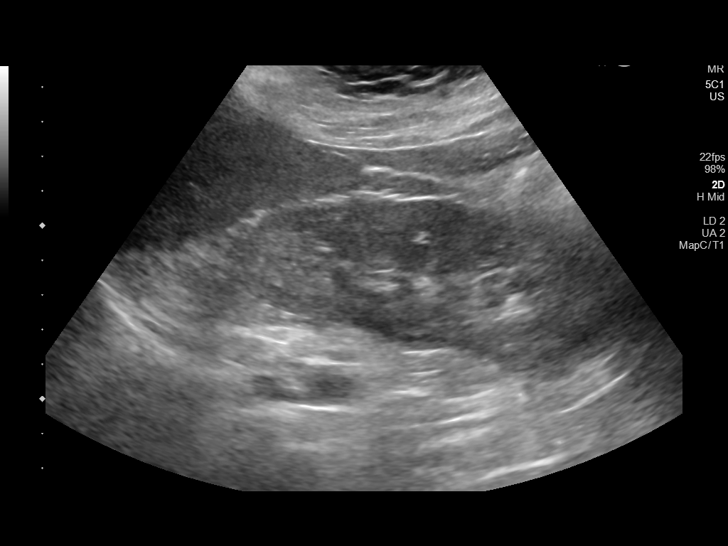
[im 20/32]
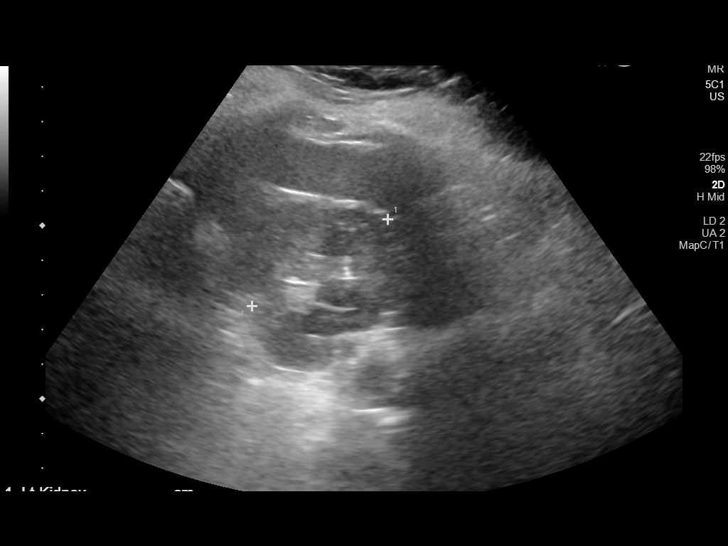
[im 21/32]
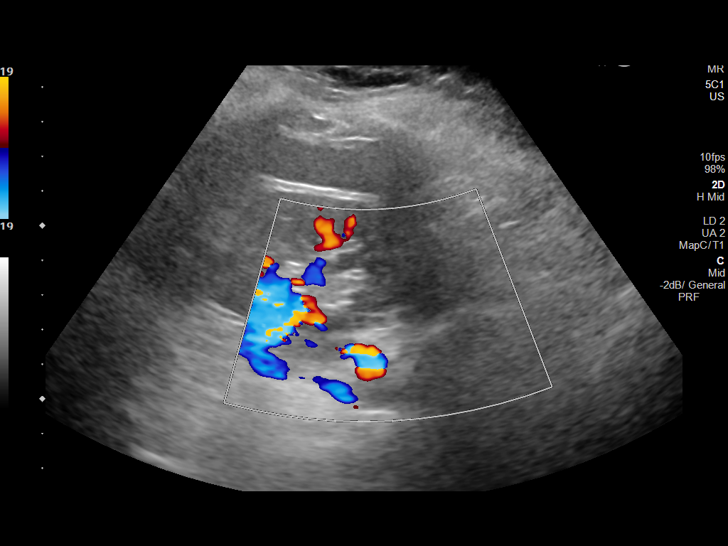
[im 24/32]
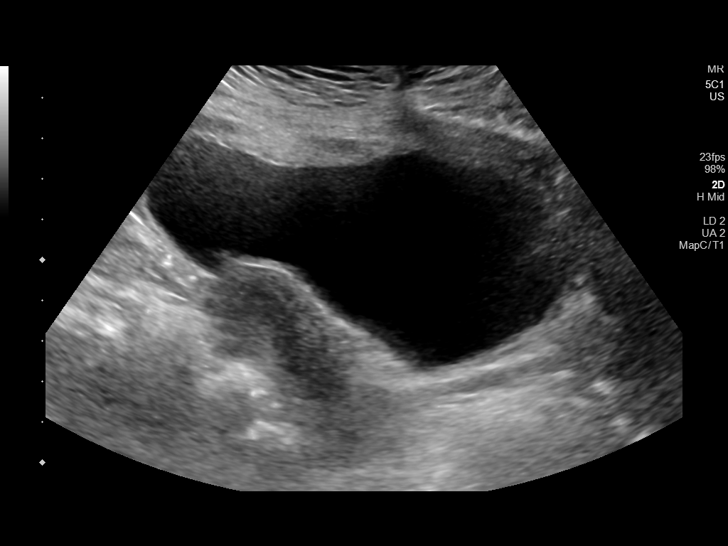
[im 26/32]
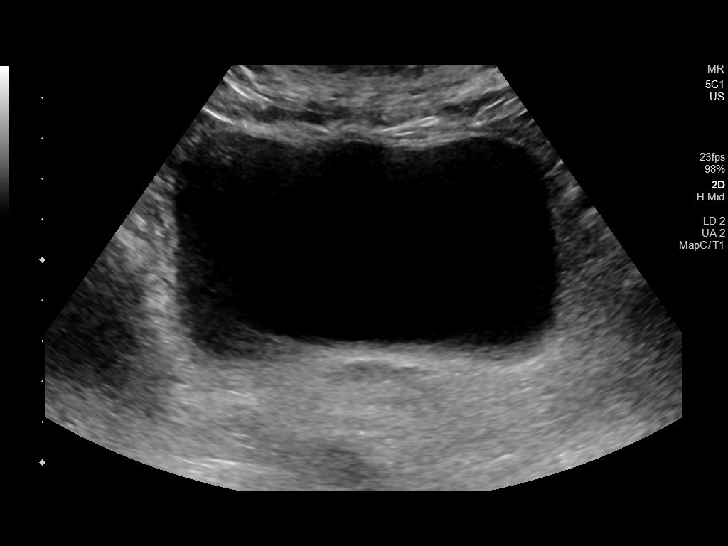
[im 29/32]
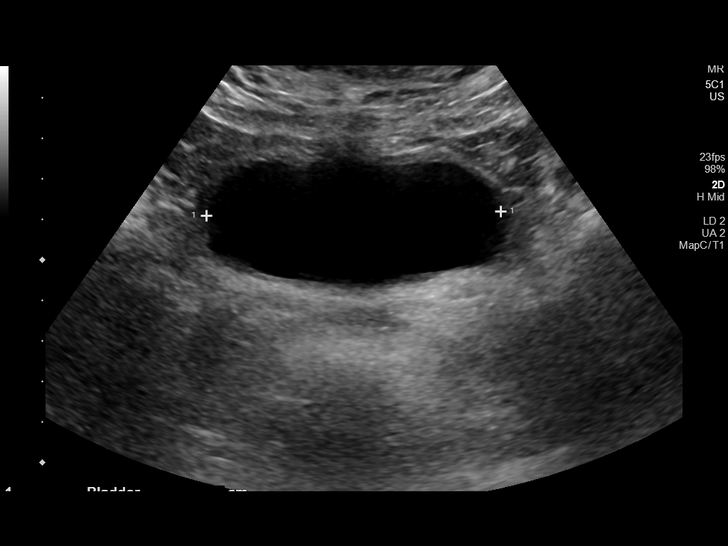
[im 32/32]
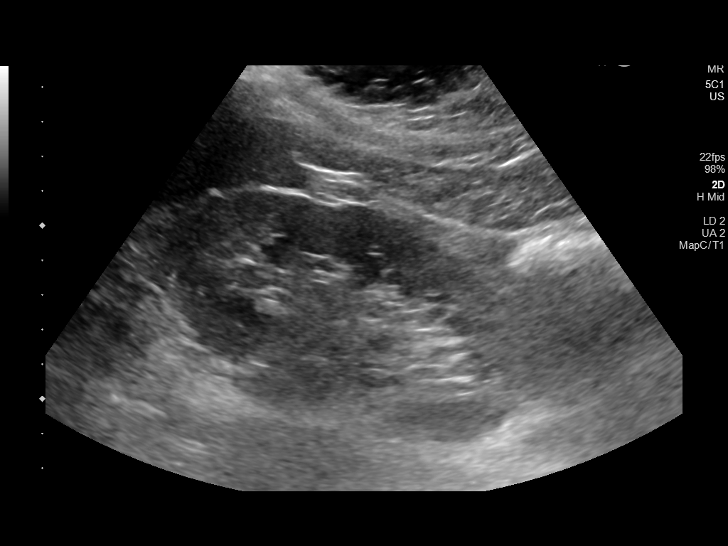

[14 of 25 positions shown; findings below may reference images not displayed]

FINDINGS: Right Kidney:

Renal measurements: 10.8 x 4.3 x 4.7 cm = volume: 116.0 mL. Normal
parenchymal echogenicity. Mild dilation of the right renal pelvis.
No renal masses or stones.

Left Kidney:

Renal measurements: 12.1 x 5.2 x 4.7 cm = volume: 153.6 mL.
Echogenicity within normal limits. No mass or hydronephrosis
visualized.

Bladder:

Appears normal for degree of bladder distention.

Other:

None.
IMPRESSION: 1. Mild dilation of the right renal pelvis. This could be
physiologic. Consider a partly obstructing distal ureteral stone if
there are consistent clinical findings.
2. No other abnormality.

## 2021-06-19 ENCOUNTER — Telehealth: Payer: Self-pay

## 2021-06-19 NOTE — Telephone Encounter (Signed)
Received call asking if Dr.Hernandez would accept as new patient referred by Dr. Ree Kida

## 2021-06-29 NOTE — Telephone Encounter (Signed)
Scheduled 07/31/21.

## 2021-07-30 ENCOUNTER — Other Ambulatory Visit: Payer: Self-pay

## 2021-07-31 ENCOUNTER — Ambulatory Visit (INDEPENDENT_AMBULATORY_CARE_PROVIDER_SITE_OTHER): Payer: Commercial Managed Care - PPO | Admitting: Internal Medicine

## 2021-07-31 ENCOUNTER — Encounter: Payer: Self-pay | Admitting: Internal Medicine

## 2021-07-31 VITALS — BP 100/60 | HR 81 | Temp 98.0°F | Ht 62.0 in | Wt 113.1 lb

## 2021-07-31 DIAGNOSIS — D367 Benign neoplasm of other specified sites: Secondary | ICD-10-CM

## 2021-07-31 DIAGNOSIS — Z23 Encounter for immunization: Secondary | ICD-10-CM

## 2021-07-31 DIAGNOSIS — Z0000 Encounter for general adult medical examination without abnormal findings: Secondary | ICD-10-CM | POA: Diagnosis not present

## 2021-07-31 LAB — CBC WITH DIFFERENTIAL/PLATELET
Basophils Absolute: 0 10*3/uL (ref 0.0–0.1)
Basophils Relative: 0.7 % (ref 0.0–3.0)
Eosinophils Absolute: 0.1 10*3/uL (ref 0.0–0.7)
Eosinophils Relative: 1 % (ref 0.0–5.0)
HCT: 34.2 % — ABNORMAL LOW (ref 36.0–46.0)
Hemoglobin: 11.1 g/dL — ABNORMAL LOW (ref 12.0–15.0)
Lymphocytes Relative: 37.4 % (ref 12.0–46.0)
Lymphs Abs: 2 10*3/uL (ref 0.7–4.0)
MCHC: 32.5 g/dL (ref 30.0–36.0)
MCV: 80.5 fl (ref 78.0–100.0)
Monocytes Absolute: 0.4 10*3/uL (ref 0.1–1.0)
Monocytes Relative: 8.5 % (ref 3.0–12.0)
Neutro Abs: 2.7 10*3/uL (ref 1.4–7.7)
Neutrophils Relative %: 52.4 % (ref 43.0–77.0)
Platelets: 238 10*3/uL (ref 150.0–400.0)
RBC: 4.24 Mil/uL (ref 3.87–5.11)
RDW: 14 % (ref 11.5–15.5)
WBC: 5.2 10*3/uL (ref 4.0–10.5)

## 2021-07-31 LAB — COMPREHENSIVE METABOLIC PANEL
ALT: 13 U/L (ref 0–35)
AST: 19 U/L (ref 0–37)
Albumin: 4.5 g/dL (ref 3.5–5.2)
Alkaline Phosphatase: 54 U/L (ref 39–117)
BUN: 16 mg/dL (ref 6–23)
CO2: 30 mEq/L (ref 19–32)
Calcium: 9 mg/dL (ref 8.4–10.5)
Chloride: 104 mEq/L (ref 96–112)
Creatinine, Ser: 0.61 mg/dL (ref 0.40–1.20)
GFR: 110 mL/min (ref >60.00)
Glucose, Bld: 73 mg/dL (ref 70–99)
Potassium: 3.3 mEq/L — ABNORMAL LOW (ref 3.5–5.1)
Sodium: 141 mEq/L (ref 135–145)
Total Bilirubin: 0.3 mg/dL (ref 0.2–1.2)
Total Protein: 7.4 g/dL (ref 6.0–8.3)

## 2021-07-31 LAB — VITAMIN D 25 HYDROXY (VIT D DEFICIENCY, FRACTURES): VITD: 16.3 ng/mL — ABNORMAL LOW (ref 30.00–100.00)

## 2021-07-31 LAB — HEMOGLOBIN A1C: Hgb A1c MFr Bld: 5.7 % (ref 4.6–6.5)

## 2021-07-31 LAB — LIPID PANEL
Cholesterol: 151 mg/dL (ref 0–200)
HDL: 74.4 mg/dL (ref >39.00)
LDL Cholesterol: 63 mg/dL (ref 0–99)
NonHDL: 77.09
Total CHOL/HDL Ratio: 2
Triglycerides: 70 mg/dL (ref 0.0–149.0)
VLDL: 14 mg/dL (ref 0.0–40.0)

## 2021-07-31 LAB — VITAMIN B12: Vitamin B-12: 319 pg/mL (ref 211–911)

## 2021-07-31 LAB — TSH: TSH: 1.01 u[IU]/mL (ref 0.35–5.50)

## 2021-07-31 NOTE — Patient Instructions (Signed)
-  Nice seeing you today!!  -Lab work today; will notify you once results are available.  -Flu and tetanus vaccines today.  -Schedule follow up in 1 year or sooner as needed.

## 2021-07-31 NOTE — Progress Notes (Signed)
New Patient Office Visit     This visit occurred during the SARS-CoV-2 public health emergency.  Safety protocols were in place, including screening questions prior to the visit, additional usage of staff PPE, and extensive cleaning of exam room while observing appropriate contact time as indicated for disinfecting solutions.    CC/Reason for Visit: Establish care, annual preventive exam Previous PCP: None Last Visit: Unknown  HPI: Breanna Jenkins is a 40 y.o. female who is coming in today for the above mentioned reasons.  She has no past medical history.  She has routine eye and dental care.  She sees a gynecologist and had a Pap smear in 2021 and a mammogram that was normal in September 2022.  She is overdue for flu, Tdap vaccinations.  She states she has had 3 COVID vaccines but does not have the details of the dates.  She does not smoke, she does not drink, she has no known drug allergies.  Her past surgical history significant for a C-section and bilateral oophorectomy.  She does not know her family history as she is adopted.  He has a cyst over the dorsum of her right upper arm right upper arm elbow would like me to look at.  Past Medical/Surgical History: Past Medical History:  Diagnosis Date   Ovarian tumor of borderline malignancy, right     Past Surgical History:  Procedure Laterality Date   CESAREAN SECTION  08/29/2011   Procedure: CESAREAN SECTION;  Surgeon: Cyril Mourning, MD;  Location: [facility];  Service: Gynecology;  Laterality: N/A;   LAPAROTOMY Right 06/17/2018   Laparotomy with Right Salpingo ophorectomy;  Surgeon: Linda Hedges, DO;  Location: [facility];  Service: Gynecology;  Laterality: Right;  removal of right adnexal mass with possible BSO   LASIK     SALPINGOOPHORECTOMY Left 2009   teratoma per pathology report   SALPINGOOPHORECTOMY Right 06/17/2018   with laparotomy above 05/2018    Social History:  reports that she has never smoked. She has never used  smokeless tobacco. She reports that she does not drink alcohol and does not use drugs.  Allergies: No Known Allergies  Family History:  Family History  Adopted: Yes  Problem Relation Age of Onset   Colon cancer Neg Hx    Esophageal cancer Neg Hx    Pancreatic cancer Neg Hx    Diabetes Neg Hx     No current outpatient medications on file.  Review of Systems:  Constitutional: Denies fever, chills, diaphoresis, appetite change and fatigue.  HEENT: Denies photophobia, eye pain, redness, hearing loss, ear pain, congestion, sore throat, rhinorrhea, sneezing, mouth sores, trouble swallowing, neck pain, neck stiffness and tinnitus.   Respiratory: Denies SOB, DOE, cough, chest tightness,  and wheezing.   Cardiovascular: Denies chest pain, palpitations and leg swelling.  Gastrointestinal: Denies nausea, vomiting, abdominal pain, diarrhea, constipation, blood in stool and abdominal distention.  Genitourinary: Denies dysuria, urgency, frequency, hematuria, flank pain and difficulty urinating.  Endocrine: Denies: hot or cold intolerance, sweats, changes in hair or nails, polyuria, polydipsia. Musculoskeletal: Denies myalgias, back pain, joint swelling, arthralgias and gait problem.  Skin: Denies pallor, rash and wound.  Neurological: Denies dizziness, seizures, syncope, weakness, light-headedness, numbness and headaches.  Hematological: Denies adenopathy. Easy bruising, personal or family bleeding history  Psychiatric/Behavioral: Denies suicidal ideation, mood changes, confusion, nervousness, sleep disturbance and agitation    Physical Exam: Vitals:   07/31/21 1417  BP: 100/60  Pulse: 81  Temp: 98 F (36.7 C)  TempSrc: Oral  SpO2: 98%  Weight: 113 lb 1.6 oz (51.3 kg)  Height: 5\' 2"  (1.575 m)   Body mass index is 20.69 kg/m.  Constitutional: NAD, calm, comfortable Eyes: PERRL, lids and conjunctivae normal ENMT: Mucous membranes are moist. Posterior pharynx clear of any exudate or  lesions. Normal dentition. Tympanic membrane is pearly white, no erythema or bulging. Neck: normal, supple, no masses, no thyromegaly Respiratory: clear to auscultation bilaterally, no wheezing, no crackles. Normal respiratory effort. No accessory muscle use.  Cardiovascular: Regular rate and rhythm, no murmurs / rubs / gallops. No extremity edema. 2+ pedal pulses. No carotid bruits.  Abdomen: no tenderness, no masses palpated. No hepatosplenomegaly. Bowel sounds positive.  Musculoskeletal: no clubbing / cyanosis. No joint deformity upper and lower extremities. Good ROM, no contractures. Normal muscle tone.  Skin: Small induration that is freely mobile probably around 1 and half centimeter in length over the dorsum of her right upper arm above the elbow Neurologic: CN 2-12 grossly intact. Sensation intact, DTR normal. Strength 5/5 in all 4.  Psychiatric: Normal judgment and insight. Alert and oriented x 3. Normal mood.    Impression and Plan:  Encounter for preventive health examination  - Plan: CBC with Differential/Platelet, Comprehensive metabolic panel, Hemoglobin A1c, Lipid panel, TSH, Vitamin B12, VITAMIN D 25 Hydroxy (Vit-D Deficiency, Fractures) -She has routine dental care. -Flu and Tdap have been administered today.  She will procure her COVID vaccination information for [place]. -Labs will be updated today. -Healthy lifestyle discussed in detail. -Commence routine colon cancer screening age 8. -She had a mammogram in September 2022. -She had a Pap smear in the summer 2021 and follows routinely with GYN.  Need for influenza vaccination -Influenza vaccine administered today.  Need for Tdap vaccination -Tdap administered today.  Cyst, dermoid, arm, right  - Plan: Ambulatory referral to Dermatology     Patient Instructions  -Nice seeing you today!!  -Lab work today; will notify you once results are available.  -Flu and tetanus vaccines today.  -Schedule follow up in 1  year or sooner as needed.     Lelon Frohlich, MD [facility] at [facility]

## 2021-08-09 ENCOUNTER — Encounter: Payer: Self-pay | Admitting: Internal Medicine

## 2021-08-09 ENCOUNTER — Other Ambulatory Visit: Payer: Self-pay | Admitting: Internal Medicine

## 2021-08-09 DIAGNOSIS — E876 Hypokalemia: Secondary | ICD-10-CM

## 2021-08-09 DIAGNOSIS — E559 Vitamin D deficiency, unspecified: Secondary | ICD-10-CM | POA: Insufficient documentation

## 2021-08-09 DIAGNOSIS — D649 Anemia, unspecified: Secondary | ICD-10-CM | POA: Insufficient documentation

## 2021-08-09 MED ORDER — POTASSIUM CHLORIDE CRYS ER 20 MEQ PO TBCR: 20.0000 meq | EXTENDED_RELEASE_TABLET | Freq: Every day | ORAL | 0 refills | Status: DC

## 2021-08-09 MED ORDER — VITAMIN D (ERGOCALCIFEROL) 1.25 MG (50000 UNIT) PO CAPS: 50000.0000 [IU] | ORAL_CAPSULE | ORAL | 0 refills | Status: AC

## 2021-08-16 ENCOUNTER — Other Ambulatory Visit: Payer: Self-pay | Admitting: Internal Medicine

## 2021-08-16 DIAGNOSIS — E559 Vitamin D deficiency, unspecified: Secondary | ICD-10-CM

## 2021-08-16 DIAGNOSIS — E876 Hypokalemia: Secondary | ICD-10-CM

## 2021-08-28 ENCOUNTER — Other Ambulatory Visit: Payer: Commercial Managed Care - PPO

## 2021-08-29 ENCOUNTER — Other Ambulatory Visit (INDEPENDENT_AMBULATORY_CARE_PROVIDER_SITE_OTHER): Payer: Commercial Managed Care - PPO

## 2021-08-29 ENCOUNTER — Other Ambulatory Visit: Payer: Self-pay

## 2021-08-29 DIAGNOSIS — E559 Vitamin D deficiency, unspecified: Secondary | ICD-10-CM

## 2021-08-29 DIAGNOSIS — E876 Hypokalemia: Secondary | ICD-10-CM | POA: Diagnosis not present

## 2021-08-29 LAB — BASIC METABOLIC PANEL
BUN: 16 mg/dL (ref 6–23)
CO2: 30 mEq/L (ref 19–32)
Calcium: 9.2 mg/dL (ref 8.4–10.5)
Chloride: 104 mEq/L (ref 96–112)
Creatinine, Ser: 0.62 mg/dL (ref 0.40–1.20)
GFR: 109.5 mL/min (ref >60.00)
Glucose, Bld: 82 mg/dL (ref 70–99)
Potassium: 3.4 mEq/L — ABNORMAL LOW (ref 3.5–5.1)
Sodium: 142 mEq/L (ref 135–145)

## 2021-08-29 LAB — VITAMIN D 25 HYDROXY (VIT D DEFICIENCY, FRACTURES): VITD: 32.96 ng/mL (ref 30.00–100.00)

## 2022-08-05 ENCOUNTER — Encounter: Payer: Commercial Managed Care - PPO | Admitting: Internal Medicine

## 2022-08-12 ENCOUNTER — Encounter: Payer: Commercial Managed Care - PPO | Admitting: Internal Medicine

## 2022-08-26 ENCOUNTER — Encounter: Payer: Self-pay | Admitting: Internal Medicine

## 2022-08-26 ENCOUNTER — Ambulatory Visit (INDEPENDENT_AMBULATORY_CARE_PROVIDER_SITE_OTHER): Payer: BC Managed Care – PPO | Admitting: Internal Medicine

## 2022-08-26 VITALS — BP 108/68 | HR 67 | Temp 97.9°F | Ht 61.0 in | Wt 117.0 lb

## 2022-08-26 DIAGNOSIS — E559 Vitamin D deficiency, unspecified: Secondary | ICD-10-CM | POA: Diagnosis not present

## 2022-08-26 DIAGNOSIS — L309 Dermatitis, unspecified: Secondary | ICD-10-CM

## 2022-08-26 DIAGNOSIS — Z23 Encounter for immunization: Secondary | ICD-10-CM | POA: Diagnosis not present

## 2022-08-26 DIAGNOSIS — Z0000 Encounter for general adult medical examination without abnormal findings: Secondary | ICD-10-CM | POA: Diagnosis not present

## 2022-08-26 LAB — CBC WITH DIFFERENTIAL/PLATELET
Basophils Absolute: 0 10*3/uL (ref 0.0–0.1)
Basophils Relative: 0.2 % (ref 0.0–3.0)
Eosinophils Absolute: 0.1 10*3/uL (ref 0.0–0.7)
Eosinophils Relative: 1.8 % (ref 0.0–5.0)
HCT: 37.2 % (ref 36.0–46.0)
Hemoglobin: 12 g/dL (ref 12.0–15.0)
Lymphocytes Relative: 40.1 % (ref 12.0–46.0)
Lymphs Abs: 2.7 10*3/uL (ref 0.7–4.0)
MCHC: 32.2 g/dL (ref 30.0–36.0)
MCV: 81 fl (ref 78.0–100.0)
Monocytes Absolute: 0.6 10*3/uL (ref 0.1–1.0)
Monocytes Relative: 8.4 % (ref 3.0–12.0)
Neutro Abs: 3.3 10*3/uL (ref 1.4–7.7)
Neutrophils Relative %: 49.5 % (ref 43.0–77.0)
Platelets: 278 10*3/uL (ref 150.0–400.0)
RBC: 4.59 Mil/uL (ref 3.87–5.11)
RDW: 12.9 % (ref 11.5–15.5)
WBC: 6.7 10*3/uL (ref 4.0–10.5)

## 2022-08-26 LAB — COMPREHENSIVE METABOLIC PANEL
ALT: 26 U/L (ref 0–35)
AST: 25 U/L (ref 0–37)
Albumin: 4.8 g/dL (ref 3.5–5.2)
Alkaline Phosphatase: 55 U/L (ref 39–117)
BUN: 17 mg/dL (ref 6–23)
CO2: 31 mEq/L (ref 19–32)
Calcium: 9.8 mg/dL (ref 8.4–10.5)
Chloride: 101 mEq/L (ref 96–112)
Creatinine, Ser: 0.61 mg/dL (ref 0.40–1.20)
GFR: 109.17 mL/min (ref >60.00)
Glucose, Bld: 82 mg/dL (ref 70–99)
Potassium: 3.8 mEq/L (ref 3.5–5.1)
Sodium: 140 mEq/L (ref 135–145)
Total Bilirubin: 0.3 mg/dL (ref 0.2–1.2)
Total Protein: 7.8 g/dL (ref 6.0–8.3)

## 2022-08-26 LAB — LIPID PANEL
Cholesterol: 169 mg/dL (ref 0–200)
HDL: 75.5 mg/dL (ref >39.00)
LDL Cholesterol: 80 mg/dL (ref 0–99)
NonHDL: 93.45
Total CHOL/HDL Ratio: 2
Triglycerides: 69 mg/dL (ref 0.0–149.0)
VLDL: 13.8 mg/dL (ref 0.0–40.0)

## 2022-08-26 LAB — VITAMIN D 25 HYDROXY (VIT D DEFICIENCY, FRACTURES): VITD: 22.36 ng/mL — ABNORMAL LOW (ref 30.00–100.00)

## 2022-08-26 MED ORDER — TRIAMCINOLONE ACETONIDE 0.1 % EX CREA: 1.0000 | TOPICAL_CREAM | Freq: Two times a day (BID) | CUTANEOUS | 0 refills | Status: DC

## 2022-08-26 NOTE — Progress Notes (Signed)
Established Patient Office Visit     CC/Reason for Visit: Annual preventive exam  HPI: Breanna Jenkins is a 41 y.o. female who is coming in today for the above mentioned reasons. Past Medical History is significant for: Vitamin D deficiency.  She has an eczematous rash over her right arm and is requesting something to help with the itching.  She is adopted, does not know her family history.  She has no acute concerns or complaints today.   Past Medical/Surgical History: Past Medical History:  Diagnosis Date   Ovarian tumor of borderline malignancy, right     Past Surgical History:  Procedure Laterality Date   CESAREAN SECTION  08/29/2011   Procedure: CESAREAN SECTION;  Surgeon: Cyril Mourning, MD;  Location: [facility];  Service: Gynecology;  Laterality: N/A;   LAPAROTOMY Right 06/17/2018   Laparotomy with Right Salpingo ophorectomy;  Surgeon: Linda Hedges, DO;  Location: [facility];  Service: Gynecology;  Laterality: Right;  removal of right adnexal mass with possible BSO   LASIK     SALPINGOOPHORECTOMY Left 2009   teratoma per pathology report   SALPINGOOPHORECTOMY Right 06/17/2018   with laparotomy above 05/2018    Social History:  reports that she has never smoked. She has never used smokeless tobacco. She reports that she does not drink alcohol and does not use drugs.  Allergies: No Known Allergies  Family History:  Family History  Adopted: Yes  Problem Relation Age of Onset   Colon cancer Neg Hx    Esophageal cancer Neg Hx    Pancreatic cancer Neg Hx    Diabetes Neg Hx      Current Outpatient Medications:    triamcinolone cream (KENALOG) 0.1 %, Apply 1 Application topically 2 (two) times daily., Disp: 453 g, Rfl: 0  Review of Systems:  Constitutional: Denies fever, chills, diaphoresis, appetite change and fatigue.  HEENT: Denies photophobia, eye pain, redness, hearing loss, ear pain, congestion, sore throat, rhinorrhea, sneezing, mouth sores, trouble swallowing,  neck pain, neck stiffness and tinnitus.   Respiratory: Denies SOB, DOE, cough, chest tightness,  and wheezing.   Cardiovascular: Denies chest pain, palpitations and leg swelling.  Gastrointestinal: Denies nausea, vomiting, abdominal pain, diarrhea, constipation, blood in stool and abdominal distention.  Genitourinary: Denies dysuria, urgency, frequency, hematuria, flank pain and difficulty urinating.  Endocrine: Denies: hot or cold intolerance, sweats, changes in hair or nails, polyuria, polydipsia. Musculoskeletal: Denies myalgias, back pain, joint swelling, arthralgias and gait problem.  Skin: Denies pallor, rash and wound.  Neurological: Denies dizziness, seizures, syncope, weakness, light-headedness, numbness and headaches.  Hematological: Denies adenopathy. Easy bruising, personal or family bleeding history  Psychiatric/Behavioral: Denies suicidal ideation, mood changes, confusion, nervousness, sleep disturbance and agitation    Physical Exam: Vitals:   08/26/22 1000  BP: 108/68  Pulse: 67  Temp: 97.9 F (36.6 C)  TempSrc: Oral  SpO2: 98%  Weight: 117 lb (53.1 kg)  Height: '5\' 1"'$  (1.549 m)    Body mass index is 22.11 kg/m.   Constitutional: NAD, calm, comfortable Eyes: PERRL, lids and conjunctivae normal ENMT: Mucous membranes are moist. Posterior pharynx clear of any exudate or lesions. Normal dentition. Tympanic membrane is pearly white, no erythema or bulging. Neck: normal, supple, no masses, no thyromegaly Respiratory: clear to auscultation bilaterally, no wheezing, no crackles. Normal respiratory effort. No accessory muscle use.  Cardiovascular: Regular rate and rhythm, no murmurs / rubs / gallops. No extremity edema. 2+ pedal pulses. No carotid bruits.  Abdomen: no tenderness,  no masses palpated. No hepatosplenomegaly. Bowel sounds positive.  Musculoskeletal: no clubbing / cyanosis. No joint deformity upper and lower extremities. Good ROM, no contractures. Normal  muscle tone.  Skin: no rashes, lesions, ulcers. No induration Neurologic: CN 2-12 grossly intact. Sensation intact, DTR normal. Strength 5/5 in all 4.  Psychiatric: Normal judgment and insight. Alert and oriented x 3. Normal mood.   [place] Visit from 08/26/2022 in [facility]  PHQ-9 Total Score 0         Impression and Plan:  Needs flu shot - Plan: Flu Vaccine QUAD 6+ mos PF IM (Fluarix Quad PF)  Encounter for preventive health examination - Plan: CBC with Differential/Platelet, Comprehensive metabolic panel, Lipid panel  Eczema, unspecified type - Plan: triamcinolone cream (KENALOG) 0.1 %  Vitamin D deficiency - Plan: VITAMIN D 25 Hydroxy (Vit-D Deficiency, Fractures)   -Recommend routine eye and dental care. -Immunizations: Flu vaccine in office today, advised to get COVID-vaccine at pharmacy, Tdap up-to-date -Healthy lifestyle discussed in detail. -Labs to be updated today. -Colon cancer screening: Commence age 19 -Breast cancer screening: Overdue, prefers to have it done at GYN office -Cervical cancer screening: Overdue, GYN -Lung cancer screening: Not applicable -Prostate cancer screening: Not applicable -DEXA: Not applicable  -She is requesting a refill of triamcinolone cream for her eczema that is more prominent over the dorsal aspect of her right hand.     Lelon Frohlich, MD [facility] at [facility]

## 2022-08-27 ENCOUNTER — Other Ambulatory Visit: Payer: Self-pay | Admitting: *Deleted

## 2022-08-27 ENCOUNTER — Other Ambulatory Visit: Payer: Self-pay | Admitting: Internal Medicine

## 2022-08-27 DIAGNOSIS — E559 Vitamin D deficiency, unspecified: Secondary | ICD-10-CM

## 2022-08-27 MED ORDER — VITAMIN D (ERGOCALCIFEROL) 1.25 MG (50000 UNIT) PO CAPS: 50000.0000 [IU] | ORAL_CAPSULE | ORAL | 0 refills | Status: AC

## 2022-11-16 ENCOUNTER — Other Ambulatory Visit: Payer: Self-pay | Admitting: Internal Medicine

## 2022-11-16 DIAGNOSIS — E559 Vitamin D deficiency, unspecified: Secondary | ICD-10-CM

## 2023-03-13 LAB — HM MAMMOGRAPHY

## 2023-03-19 LAB — HM PAP SMEAR: HPV, high-risk: NEGATIVE

## 2023-06-10 ENCOUNTER — Encounter: Payer: Self-pay | Admitting: Internal Medicine

## 2023-06-10 ENCOUNTER — Ambulatory Visit (INDEPENDENT_AMBULATORY_CARE_PROVIDER_SITE_OTHER): Payer: 59 | Admitting: Internal Medicine

## 2023-06-10 VITALS — BP 98/76 | HR 77 | Temp 97.6°F | Ht 61.0 in | Wt 121.6 lb

## 2023-06-10 DIAGNOSIS — R21 Rash and other nonspecific skin eruption: Secondary | ICD-10-CM | POA: Diagnosis not present

## 2023-06-10 NOTE — Progress Notes (Signed)
     Established Patient Office Visit     CC/Reason for Visit: Rash  HPI: Breanna Jenkins is a 42 y.o. female who is coming in today for the above mentioned reasons.  She was at the beach.  While there started developing an itchy, papular rash over her forearms and upper torso.  No new products that she can think of.  Her sunscreen was from a preopened bottle that she had at home that is not expired.  No new lotions, creams, soaps, hair care products, laundry detergent.   Past Medical/Surgical History: Past Medical History:  Diagnosis Date   Ovarian tumor of borderline malignancy, right     Past Surgical History:  Procedure Laterality Date   CESAREAN SECTION  08/29/2011   Procedure: CESAREAN SECTION;  Surgeon: Jeani Hawking, MD;  Location: [facility];  Service: Gynecology;  Laterality: N/A;   LAPAROTOMY Right 06/17/2018   Laparotomy with Right Salpingo ophorectomy;  Surgeon: Mitchel Honour, DO;  Location: [facility];  Service: Gynecology;  Laterality: Right;  removal of right adnexal mass with possible BSO   LASIK     SALPINGOOPHORECTOMY Left 2009   teratoma per pathology report   SALPINGOOPHORECTOMY Right 06/17/2018   with laparotomy above 05/2018    Social History:  reports that she has never smoked. She has never used smokeless tobacco. She reports that she does not drink alcohol and does not use drugs.  Allergies: No Known Allergies  Family History:  Family History  Adopted: Yes  Problem Relation Age of Onset   Colon cancer Neg Hx    Esophageal cancer Neg Hx    Pancreatic cancer Neg Hx    Diabetes Neg Hx      Current Outpatient Medications:    triamcinolone cream (KENALOG) 0.1 %, Apply 1 Application topically 2 (two) times daily., Disp: 453 g, Rfl: 0  Review of Systems:  Negative unless indicated in HPI.   Physical Exam: Vitals:   06/10/23 0830  BP: 98/76  Pulse: 77  Temp: 97.6 F (36.4 C)  TempSrc: Oral  SpO2: 99%  Weight: 121 lb 9.6 oz (55.2 kg)  Height:  5\' 1"  (1.549 m)    Body mass index is 22.98 kg/m.   Physical Exam Skin:    Comments: Papular rash over forearms and upper torso that is excoriated.      Impression and Plan:  Rash  -Etiology unclear, suspect sun exposure with excoriations due to scratching.  At this point rash is mostly healed.  She has a tub with triamcinolone cream that she can apply as needed for itching.   Time spent:21 minutes reviewing chart, interviewing and examining patient and formulating plan of care.     Chaya Jan, MD [facility]

## 2023-10-21 ENCOUNTER — Encounter: Payer: Self-pay | Admitting: Internal Medicine

## 2023-10-21 ENCOUNTER — Ambulatory Visit (INDEPENDENT_AMBULATORY_CARE_PROVIDER_SITE_OTHER): Payer: 59 | Admitting: Internal Medicine

## 2023-10-21 VITALS — BP 100/74 | HR 81 | Temp 97.6°F | Ht 61.0 in | Wt 125.7 lb

## 2023-10-21 DIAGNOSIS — Z1211 Encounter for screening for malignant neoplasm of colon: Secondary | ICD-10-CM

## 2023-10-21 DIAGNOSIS — Z1159 Encounter for screening for other viral diseases: Secondary | ICD-10-CM

## 2023-10-21 DIAGNOSIS — E559 Vitamin D deficiency, unspecified: Secondary | ICD-10-CM

## 2023-10-21 DIAGNOSIS — Z23 Encounter for immunization: Secondary | ICD-10-CM

## 2023-10-21 DIAGNOSIS — Z0000 Encounter for general adult medical examination without abnormal findings: Secondary | ICD-10-CM | POA: Diagnosis not present

## 2023-10-21 LAB — CBC WITH DIFFERENTIAL/PLATELET
Basophils Absolute: 0 10*3/uL (ref 0.0–0.1)
Basophils Relative: 0.5 % (ref 0.0–3.0)
Eosinophils Absolute: 0.1 10*3/uL (ref 0.0–0.7)
Eosinophils Relative: 2.2 % (ref 0.0–5.0)
HCT: 38 % (ref 36.0–46.0)
Hemoglobin: 12.1 g/dL (ref 12.0–15.0)
Lymphocytes Relative: 43.5 % (ref 12.0–46.0)
Lymphs Abs: 2.7 10*3/uL (ref 0.7–4.0)
MCHC: 31.8 g/dL (ref 30.0–36.0)
MCV: 82.3 fL (ref 78.0–100.0)
Monocytes Absolute: 0.5 10*3/uL (ref 0.1–1.0)
Monocytes Relative: 8.1 % (ref 3.0–12.0)
Neutro Abs: 2.9 10*3/uL (ref 1.4–7.7)
Neutrophils Relative %: 45.7 % (ref 43.0–77.0)
Platelets: 314 10*3/uL (ref 150.0–400.0)
RBC: 4.61 Mil/uL (ref 3.87–5.11)
RDW: 13.3 % (ref 11.5–15.5)
WBC: 6.3 10*3/uL (ref 4.0–10.5)

## 2023-10-21 LAB — COMPREHENSIVE METABOLIC PANEL
ALT: 40 U/L — ABNORMAL HIGH (ref 0–35)
AST: 28 U/L (ref 0–37)
Albumin: 4.8 g/dL (ref 3.5–5.2)
Alkaline Phosphatase: 61 U/L (ref 39–117)
BUN: 22 mg/dL (ref 6–23)
CO2: 30 meq/L (ref 19–32)
Calcium: 9.6 mg/dL (ref 8.4–10.5)
Chloride: 101 meq/L (ref 96–112)
Creatinine, Ser: 0.6 mg/dL (ref 0.40–1.20)
GFR: 108.72 mL/min (ref >60.00)
Glucose, Bld: 97 mg/dL (ref 70–99)
Potassium: 3.8 meq/L (ref 3.5–5.1)
Sodium: 140 meq/L (ref 135–145)
Total Bilirubin: 0.3 mg/dL (ref 0.2–1.2)
Total Protein: 7.6 g/dL (ref 6.0–8.3)

## 2023-10-21 LAB — LIPID PANEL
Cholesterol: 193 mg/dL (ref 0–200)
HDL: 76.3 mg/dL (ref >39.00)
LDL Cholesterol: 93 mg/dL (ref 0–99)
NonHDL: 116.71
Total CHOL/HDL Ratio: 3
Triglycerides: 119 mg/dL (ref 0.0–149.0)
VLDL: 23.8 mg/dL (ref 0.0–40.0)

## 2023-10-21 LAB — HEMOGLOBIN A1C: Hgb A1c MFr Bld: 6.2 % (ref 4.6–6.5)

## 2023-10-21 LAB — VITAMIN B12: Vitamin B-12: 477 pg/mL (ref 211–911)

## 2023-10-21 LAB — TSH: TSH: 1.55 u[IU]/mL (ref 0.35–5.50)

## 2023-10-21 LAB — VITAMIN D 25 HYDROXY (VIT D DEFICIENCY, FRACTURES): VITD: 31.47 ng/mL (ref 30.00–100.00)

## 2023-10-21 NOTE — Progress Notes (Signed)
 Established Patient Office Visit     CC/Reason for Visit: Annual preventive exam  HPI: Breanna Jenkins is a 42 y.o. female who is coming in today for the above mentioned reasons. Past Medical History is significant for: Vitamin D  deficiency.  Has routine eye and dental care.  Will be due for her for screening colonoscopy.  Follows routinely with GYN.  Is due for flu and COVID vaccines.   Past Medical/Surgical History: Past Medical History:  Diagnosis Date   Ovarian tumor of borderline malignancy, right     Past Surgical History:  Procedure Laterality Date   CESAREAN SECTION  08/29/2011   Procedure: CESAREAN SECTION;  Surgeon: Rosaline LITTIE Cobble, MD;  Location: [facility];  Service: Gynecology;  Laterality: N/A;   LAPAROTOMY Right 06/17/2018   Laparotomy with Right Salpingo ophorectomy;  Surgeon: Dannielle Bouchard, DO;  Location: [facility];  Service: Gynecology;  Laterality: Right;  removal of right adnexal mass with possible BSO   LASIK     SALPINGOOPHORECTOMY Left 2009   teratoma per pathology report   SALPINGOOPHORECTOMY Right 06/17/2018   with laparotomy above 05/2018    Social History:  reports that she has never smoked. She has never used smokeless tobacco. She reports that she does not drink alcohol and does not use drugs.  Allergies: Allergies  Allergen Reactions   Lubricants Itching    Family History:  Family History  Adopted: Yes  Problem Relation Age of Onset   Colon cancer Neg Hx    Esophageal cancer Neg Hx    Pancreatic cancer Neg Hx    Diabetes Neg Hx      Current Outpatient Medications:    triamcinolone  cream (KENALOG ) 0.1 %, Apply 1 Application topically 2 (two) times daily., Disp: 453 g, Rfl: 0  Review of Systems:  Negative unless indicated in HPI.   Physical Exam: Vitals:   10/21/23 0737  BP: 100/74  Pulse: 81  Temp: 97.6 F (36.4 C)  TempSrc: Oral  SpO2: 92%  Weight: 125 lb 11.2 oz (57 kg)  Height: 5' 1 (1.549 m)    Body mass index is  23.75 kg/m.   Physical Exam Vitals reviewed.  Constitutional:      General: She is not in acute distress.    Appearance: Normal appearance. She is not ill-appearing, toxic-appearing or diaphoretic.  HENT:     Head: Normocephalic.     Right Ear: Tympanic membrane, ear canal and external ear normal. There is no impacted cerumen.     Left Ear: Tympanic membrane, ear canal and external ear normal. There is no impacted cerumen.     Nose: Nose normal.     Mouth/Throat:     Mouth: Mucous membranes are moist.     Pharynx: Oropharynx is clear. No oropharyngeal exudate or posterior oropharyngeal erythema.  Eyes:     General: No scleral icterus.       Right eye: No discharge.        Left eye: No discharge.     Conjunctiva/sclera: Conjunctivae normal.     Pupils: Pupils are equal, round, and reactive to light.  Neck:     Vascular: No carotid bruit.  Cardiovascular:     Rate and Rhythm: Normal rate and regular rhythm.     Pulses: Normal pulses.     Heart sounds: Normal heart sounds.  Pulmonary:     Effort: Pulmonary effort is normal. No respiratory distress.     Breath sounds: Normal breath sounds.  Abdominal:  General: Abdomen is flat. Bowel sounds are normal.     Palpations: Abdomen is soft.  Musculoskeletal:        General: Normal range of motion.     Cervical back: Normal range of motion.  Skin:    General: Skin is warm and dry.  Neurological:     General: No focal deficit present.     Mental Status: She is alert and oriented to person, place, and time. Mental status is at baseline.  Psychiatric:        Mood and Affect: Mood normal.        Behavior: Behavior normal.        Thought Content: Thought content normal.        Judgment: Judgment normal.     Flowsheet Row Office Visit from 10/21/2023 in [facility] [place]  PHQ-9 Total Score 0        Impression and Plan:  Well adult exam -     CBC with Differential/Platelet; Future -      Comprehensive metabolic panel; Future -     Hemoglobin A1c; Future -     Lipid panel; Future -     TSH; Future -     Vitamin B12; Future  Vitamin D  deficiency -     VITAMIN D  25 Hydroxy (Vit-D Deficiency, Fractures); Future  Need for influenza vaccination -     Flu vaccine trivalent PF, 6mos and older(Flulaval,Afluria,Fluarix,Fluzone)  Encounter for hepatitis C screening test for low risk patient -     Hepatitis C antibody; Future  Screening for colon cancer -     Ambulatory referral to Gastroenterology   -Recommend routine eye and dental care. -Healthy lifestyle discussed in detail. -Labs to be updated today. -Prostate cancer screening: N/A Health Maintenance  Topic Date Due   Hepatitis C Screening  Never done   COVID-19 Vaccine (1 - 2024-25 season) 11/06/2023*   Pap with HPV screening  01/19/2024*   DTaP/Tdap/Td vaccine (2 - Td or Tdap) 08/01/2031   Flu Shot  Completed   HIV Screening  Completed   HPV Vaccine  Aged Out  *Topic was postponed. The date shown is not the original due date.     -Flu vaccine in office today.     Tully Theophilus Andrews, MD [facility] at [facility]

## 2023-10-22 LAB — HEPATITIS C ANTIBODY: Hepatitis C Ab: NONREACTIVE

## 2023-10-23 ENCOUNTER — Encounter: Payer: Self-pay | Admitting: Internal Medicine

## 2023-10-23 DIAGNOSIS — R7302 Impaired glucose tolerance (oral): Secondary | ICD-10-CM | POA: Insufficient documentation

## 2023-10-31 ENCOUNTER — Encounter: Payer: Self-pay | Admitting: Pediatrics

## 2023-11-12 ENCOUNTER — Ambulatory Visit: Payer: 59

## 2023-11-12 VITALS — Ht 61.0 in | Wt 125.0 lb

## 2023-11-12 DIAGNOSIS — Z1211 Encounter for screening for malignant neoplasm of colon: Secondary | ICD-10-CM

## 2023-11-12 MED ORDER — SUTAB 1479-225-188 MG PO TABS: 12.0000 | ORAL_TABLET | ORAL | 0 refills | Status: DC

## 2023-11-12 NOTE — Progress Notes (Signed)
No egg or soy allergy known to patient  No issues known to pt with past sedation with any surgeries or procedures Patient denies ever being told they had issues or difficulty with intubation  No FH of Malignant Hyperthermia Pt is not on diet pills Pt is not on  home 02  Pt is not on blood thinners  Pt denies issues with constipation  No A fib or A flutter Have any cardiac testing pending-- no  LOA: independent  Prep: sutab  Patient's chart reviewed by Cathlyn Parsons CNRA prior to previsit and patient appropriate for the [facility].  Previsit completed and red dot placed by patient's name on their procedure day (on provider's schedule).     PV completed with patient. Prep instructions sent via mychart and home address.

## 2023-11-17 ENCOUNTER — Other Ambulatory Visit: Payer: Self-pay

## 2023-11-17 DIAGNOSIS — Z1211 Encounter for screening for malignant neoplasm of colon: Secondary | ICD-10-CM

## 2023-11-17 MED ORDER — SUTAB 1479-225-188 MG PO TABS: 12.0000 | ORAL_TABLET | ORAL | 0 refills | Status: DC

## 2023-12-01 ENCOUNTER — Encounter: Payer: Self-pay | Admitting: Pediatrics

## 2023-12-02 ENCOUNTER — Telehealth: Payer: Self-pay | Admitting: Pediatrics

## 2023-12-02 NOTE — Telephone Encounter (Signed)
Pt tried to take the second Sutab back to the [facility] and was refused  return. RN explained that pharmacies can not take medications for returns to to risks. Pt states that it cost 175.00 and that she was told by the pharmacy to have  RN call. tRN called Pharmacy and was told that pt was informed of  the no return policy when she came earlier. RN to call pt and make her aware of Pharmacies reply. Pt informed of the Pharmacy stated. Pt stated she understood and even mentioned that she will pring up with husband having one to use the other box for his.  No other questions at this time.

## 2023-12-02 NOTE — Telephone Encounter (Signed)
Patient called and stated that she had received 2 Su-tab prep medication and was wondering if she can return the second Su-tab to the pharmacy. Patient is requesting a call back. Please advise.

## 2023-12-07 NOTE — Progress Notes (Unsigned)
[facility] History and Physical   Primary Care Physician:  Philip Aspen, Limmie Patricia, MD   Reason for Procedure:  Colon cancer screening  Plan:    Screening colonoscopy  HPI: Breanna Jenkins is a 43 y.o. female undergoing screening colonoscopy for colon cancer screening.  No prior history of colonoscopy.  [name] was seen by Dr. Chales Abrahams in 2019.  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  She was previously advised to undergo EGD and colonoscopy to rule out GI etiology -no record of these test being performed.  Denies current symptoms of change in bowel habits or rectal bleeding.  No documented family history of colorectal cancer.   Past Medical History:  Diagnosis Date   Ovarian tumor of borderline malignancy, right     Past Surgical History:  Procedure Laterality Date   CESAREAN SECTION  08/29/2011   Procedure: CESAREAN SECTION;  Surgeon: Jeani Hawking, MD;  Location: [facility];  Service: Gynecology;  Laterality: N/A;   LAPAROTOMY Right 06/17/2018   Laparotomy with Right Salpingo ophorectomy;  Surgeon: Mitchel Honour, DO;  Location: [facility];  Service: Gynecology;  Laterality: Right;  removal of right adnexal mass with possible BSO   LASIK     SALPINGOOPHORECTOMY Left 2009   teratoma per pathology report   SALPINGOOPHORECTOMY Right 06/17/2018   with laparotomy above 05/2018    Prior to Admission medications   Medication Sig Start Date End Date Taking? Authorizing Provider  Sodium Sulfate-Mag Sulfate-KCl (SUTAB) [phone] MG TABS Take 12 tablets by mouth as directed. 11/17/23   Ottie Glazier, MD  triamcinolone cream (KENALOG) 0.1 % Apply 1 Application topically 2 (two) times daily. 08/26/22   Philip Aspen, Limmie Patricia, MD    Current Outpatient Medications  Medication Sig Dispense Refill   triamcinolone cream (KENALOG) 0.1 % Apply 1 Application topically 2 (two) times daily. 453 g 0   No current  facility-administered medications for this visit.    Allergies as of 12/08/2023 - Review Complete 12/08/2023  Allergen Reaction Noted   Lubricants Itching 10/21/2023    Family History  Adopted: Yes  Problem Relation Age of Onset   Colon cancer Neg Hx    Esophageal cancer Neg Hx    Pancreatic cancer Neg Hx    Diabetes Neg Hx     Social History   Socioeconomic History   Marital status: Married    Spouse name: Not on file   Number of children: 1   Years of education: Not on file   Highest education level: Not on file  Occupational History   Not on file  Tobacco Use   Smoking status: Never   Smokeless tobacco: Never  Vaping Use   Vaping status: Never Used  Substance and Sexual Activity   Alcohol use: No   Drug use: No   Sexual activity: Yes  Other Topics Concern   Not on file  Social History Narrative   Not on file   Social Drivers of Health   Financial Resource Strain: Not on file  Food Insecurity: Not on file  Transportation Needs: Not on file  Physical Activity: Not on file  Stress: Not on file  Social Connections: Not on file  Intimate Partner Violence: Not on file    Review of Systems:  All other review of systems negative except as mentioned in the HPI.  Physical Exam: Vital signs BP (!) 81/42   Pulse 70   Temp (!) 97.4 F (  36.3 C)   Resp 16   Ht 5\' 1"  (1.549 m)   Wt 125 lb (56.7 kg)   LMP 06/12/2018 (Exact Date)   SpO2 99%   BMI 23.62 kg/m   General:   Alert,  Well-developed, well-nourished, pleasant and cooperative in NAD Airway:  Mallampati 2 Lungs:  Clear throughout to auscultation.   Heart:  Regular rate and rhythm; no murmurs, clicks, rubs,  or gallops. Abdomen:  Soft, nontender and nondistended. Normal bowel sounds.   Neuro/Psych:  Normal mood and affect. A and O x 3  Breanna Beach, MD [facility]

## 2023-12-08 ENCOUNTER — Encounter: Payer: Self-pay | Admitting: Pediatrics

## 2023-12-08 ENCOUNTER — Ambulatory Visit (AMBULATORY_SURGERY_CENTER): Payer: 59 | Admitting: Pediatrics

## 2023-12-08 VITALS — BP 95/51 | HR 51 | Temp 97.4°F | Resp 10 | Ht 61.0 in | Wt 125.0 lb

## 2023-12-08 DIAGNOSIS — K621 Rectal polyp: Secondary | ICD-10-CM

## 2023-12-08 DIAGNOSIS — D128 Benign neoplasm of rectum: Secondary | ICD-10-CM

## 2023-12-08 DIAGNOSIS — K648 Other hemorrhoids: Secondary | ICD-10-CM | POA: Diagnosis not present

## 2023-12-08 DIAGNOSIS — Z1211 Encounter for screening for malignant neoplasm of colon: Secondary | ICD-10-CM | POA: Diagnosis present

## 2023-12-08 MED ORDER — SODIUM CHLORIDE 0.9 % IV SOLN: 500.0000 mL | INTRAVENOUS | Status: DC

## 2023-12-08 NOTE — Progress Notes (Signed)
 Pt's states no medical or surgical changes since previsit or office visit.

## 2023-12-08 NOTE — Patient Instructions (Addendum)
Await pathology results. Repeat colonoscopy for surveillance based on  pathology results.  Please read over handouts provided  Please continue your normal medications                           YOU HAD AN ENDOSCOPIC PROCEDURE TODAY AT THE [facility]:   Refer to the procedure report that was given to you for any specific questions about what was found during the examination.  If the procedure report does not answer your questions, please call your gastroenterologist to clarify.  If you requested that your care partner not be given the details of your procedure findings, then the procedure report has been included in a sealed envelope for you to review at your convenience later.  YOU SHOULD EXPECT: Some feelings of bloating in the abdomen. Passage of more gas than usual.  Walking can help get rid of the air that was put into your GI tract during the procedure and reduce the bloating. If you had a lower endoscopy (such as a colonoscopy or flexible sigmoidoscopy) you may notice spotting of blood in your stool or on the toilet paper. If you underwent a bowel prep for your procedure, you may not have a normal bowel movement for a few days.  Please Note:  You might notice some irritation and congestion in your nose or some drainage.  This is from the oxygen used during your procedure.  There is no need for concern and it should clear up in a day or so.  SYMPTOMS TO REPORT IMMEDIATELY:  Following lower endoscopy (colonoscopy or flexible sigmoidoscopy):  Excessive amounts of blood in the stool  Significant tenderness or worsening of abdominal pains  Swelling of the abdomen that is new, acute  Fever of 100F or higher For urgent or emergent issues, a gastroenterologist can be reached at any hour by calling [phone]. Do not use MyChart messaging for urgent concerns.    DIET:  We do recommend a small meal at first, but then you may proceed to your regular diet.  Drink plenty of fluids  but you should avoid alcoholic beverages for 24 hours.  ACTIVITY:  You should plan to take it easy for the rest of today and you should NOT DRIVE or use heavy machinery until tomorrow (because of the sedation medicines used during the test).    FOLLOW UP: Our staff will call the number listed on your records the next business day following your procedure.  We will call around 7:15- 8:00 am to check on you and address any questions or concerns that you may have regarding the information given to you following your procedure. If we do not reach you, we will leave a message.     If any biopsies were taken you will be contacted by phone or by letter within the next 1-3 weeks.  Please call us at [phone] if you have not heard about the biopsies in 3 weeks.    SIGNATURES/CONFIDENTIALITY: You and/or your care partner have signed paperwork which will be entered into your electronic medical record.  These signatures attest to the fact that that the information above on your After Visit Summary has been reviewed and is understood.  Full responsibility of the confidentiality of this discharge information lies with you and/or your care-partner.

## 2023-12-08 NOTE — Op Note (Signed)
[facility] Patient Name: Breanna Jenkins Procedure Date: 12/08/2023 1:17 PM MRN: [id number] Endoscopist: Maren Beach , MD, [id number] Age: 43 Referring MD:  Date of Birth: 02/19/1981 Gender: Female Account #: [id number] Procedure:                Colonoscopy Indications:              Screening for colorectal malignant neoplasm, This                            is the patient's first colonoscopy Medicines:                Monitored Anesthesia Care Procedure:                Pre-Anesthesia Assessment:                           - Prior to the procedure, a History and Physical                            was performed, and patient medications and                            allergies were reviewed. The patient's tolerance of                            previous anesthesia was also reviewed. The risks                            and benefits of the procedure and the sedation                            options and risks were discussed with the patient.                            All questions were answered, and informed consent                            was obtained. Prior Anticoagulants: The patient has                            taken no anticoagulant or antiplatelet agents. ASA                            Grade Assessment: I - A normal, healthy patient.                            After reviewing the risks and benefits, the patient                            was deemed in satisfactory condition to undergo the                            procedure.  After obtaining informed consent, the colonoscope                            was passed under direct vision. Throughout the                            procedure, the patient's blood pressure, pulse, and                            oxygen saturations were monitored continuously. The                            Olympus Scope SN: [id number] was introduced through                            the anus and advanced to the terminal  ileum. The                            colonoscopy was performed without difficulty. The                            patient tolerated the procedure well. The quality                            of the bowel preparation was good. The terminal                            ileum, the ileocecal valve, the appendiceal orifice                            and the rectum were photographed. Scope In: 1:22:33 PM Scope Out: 1:37:21 PM Scope Withdrawal Time: 0 hours 9 minutes 54 seconds  Total Procedure Duration: 0 hours 14 minutes 48 seconds  Findings:                 The perianal and digital rectal examinations were                            normal. Pertinent negatives include normal                            sphincter tone and no palpable rectal lesions.                           A 4 mm polyp was found in the rectum. The polyp was                            sessile. The polyp was removed with a cold biopsy                            forceps. Resection and retrieval were complete.                           The terminal ileum  appeared normal.                           Internal hemorrhoids were found during retroflexion. Complications:            No immediate complications. Estimated blood loss:                            Minimal. Estimated Blood Loss:     Estimated blood loss was minimal. Impression:               - One 4 mm polyp in the rectum, removed with a cold                            biopsy forceps. Resected and retrieved.                           - The examined portion of the ileum was normal.                           - Internal hemorrhoids. Recommendation:           - Discharge patient to home (ambulatory).                           - Await pathology results.                           - Repeat colonoscopy for surveillance based on                            pathology results.                           - The findings and recommendations were discussed                            with the  patient's family.                           - Return to referring physician.                           - Patient has a contact number available for                            emergencies. The signs and symptoms of potential                            delayed complications were discussed with the                            patient. Return to normal activities tomorrow.                            Written discharge instructions were provided to the  patient. Maren Beach, MD 12/08/2023 1:41:54 PM This report has been signed electronically.

## 2023-12-08 NOTE — Progress Notes (Signed)
 Report to PACU, RN, vss, BBS= Clear.

## 2023-12-09 ENCOUNTER — Telehealth: Payer: Self-pay | Admitting: *Deleted

## 2023-12-09 NOTE — Telephone Encounter (Signed)
 Attempted post procedure follow up call.  No answer - LVM.

## 2023-12-11 LAB — SURGICAL PATHOLOGY

## 2023-12-14 ENCOUNTER — Encounter: Payer: Self-pay | Admitting: Pediatrics

## 2024-04-26 ENCOUNTER — Ambulatory Visit: Payer: 59 | Admitting: Internal Medicine

## 2024-04-26 DIAGNOSIS — R7302 Impaired glucose tolerance (oral): Secondary | ICD-10-CM

## 2024-05-04 ENCOUNTER — Ambulatory Visit (INDEPENDENT_AMBULATORY_CARE_PROVIDER_SITE_OTHER): Admitting: Internal Medicine

## 2024-05-04 ENCOUNTER — Encounter: Payer: Self-pay | Admitting: Internal Medicine

## 2024-05-04 VITALS — BP 102/64 | HR 62 | Temp 98.1°F | Wt 124.8 lb

## 2024-05-04 DIAGNOSIS — R7302 Impaired glucose tolerance (oral): Secondary | ICD-10-CM | POA: Diagnosis not present

## 2024-05-04 LAB — POCT GLYCOSYLATED HEMOGLOBIN (HGB A1C): Hemoglobin A1C: 6 % — AB (ref 4.0–5.6)

## 2024-05-04 NOTE — Progress Notes (Signed)
     Established Patient Office Visit     CC/Reason for Visit: Follow-up A1c  HPI: Breanna Jenkins is a 43 y.o. female who is coming in today for the above mentioned reasons. Past Medical History is significant for: Impaired glucose tolerance.  At last visit her A1c was 6.2.  She has been working on lifestyle changes.  Her weight has been stable.   Past Medical/Surgical History: Past Medical History:  Diagnosis Date   Ovarian tumor of borderline malignancy, right     Past Surgical History:  Procedure Laterality Date   CESAREAN SECTION  08/29/2011   Procedure: CESAREAN SECTION;  Surgeon: Rosaline LITTIE Cobble, MD;  Location: [facility];  Service: Gynecology;  Laterality: N/A;   LAPAROTOMY Right 06/17/2018   Laparotomy with Right Salpingo ophorectomy;  Surgeon: Dannielle Bouchard, DO;  Location: [facility];  Service: Gynecology;  Laterality: Right;  removal of right adnexal mass with possible BSO   LASIK     SALPINGOOPHORECTOMY Left 2009   teratoma per pathology report   SALPINGOOPHORECTOMY Right 06/17/2018   with laparotomy above 05/2018    Social History:  reports that she has never smoked. She has never used smokeless tobacco. She reports that she does not drink alcohol and does not use drugs.  Allergies: Allergies  Allergen Reactions   Lubricants Itching    Family History:  Family History  Adopted: Yes  Problem Relation Age of Onset   Colon cancer Neg Hx    Esophageal cancer Neg Hx    Pancreatic cancer Neg Hx    Diabetes Neg Hx      Current Outpatient Medications:    triamcinolone  cream (KENALOG ) 0.1 %, Apply 1 Application topically 2 (two) times daily., Disp: 453 g, Rfl: 0  Review of Systems:  Negative unless indicated in HPI.   Physical Exam: Vitals:   05/04/24 0746  BP: 102/64  Pulse: 62  Temp: 98.1 F (36.7 C)  TempSrc: Oral  SpO2: 99%  Weight: 124 lb 12.8 oz (56.6 kg)    Body mass index is 23.58 kg/m.    Impression and Plan:  IGT (impaired glucose  tolerance) -     POCT glycosylated hemoglobin (Hb A1C)   - A1c is stable to slightly improved at 6.0.  Follow-up in 6 months.  Continue to work on lifestyle changes.  Time spent:23 minutes reviewing chart, interviewing and examining patient and formulating plan of care.     Tully Theophilus Andrews, MD [facility]

## 2024-10-26 ENCOUNTER — Encounter: Payer: Self-pay | Admitting: Internal Medicine

## 2024-10-26 ENCOUNTER — Ambulatory Visit (INDEPENDENT_AMBULATORY_CARE_PROVIDER_SITE_OTHER): Admitting: Internal Medicine

## 2024-10-26 VITALS — BP 110/64 | HR 64 | Temp 98.0°F | Ht 62.0 in | Wt 121.4 lb

## 2024-10-26 DIAGNOSIS — Z23 Encounter for immunization: Secondary | ICD-10-CM

## 2024-10-26 DIAGNOSIS — Z0000 Encounter for general adult medical examination without abnormal findings: Secondary | ICD-10-CM | POA: Diagnosis not present

## 2024-10-26 DIAGNOSIS — E559 Vitamin D deficiency, unspecified: Secondary | ICD-10-CM | POA: Diagnosis not present

## 2024-10-26 DIAGNOSIS — R7302 Impaired glucose tolerance (oral): Secondary | ICD-10-CM

## 2024-10-26 DIAGNOSIS — Z1231 Encounter for screening mammogram for malignant neoplasm of breast: Secondary | ICD-10-CM

## 2024-10-26 LAB — CBC WITH DIFFERENTIAL/PLATELET
Basophils Absolute: 0 K/uL (ref 0.0–0.1)
Basophils Relative: 0.5 % (ref 0.0–3.0)
Eosinophils Absolute: 0.1 K/uL (ref 0.0–0.7)
Eosinophils Relative: 2.3 % (ref 0.0–5.0)
HCT: 37.9 % (ref 36.0–46.0)
Hemoglobin: 12.1 g/dL (ref 12.0–15.0)
Lymphocytes Relative: 40.1 % (ref 12.0–46.0)
Lymphs Abs: 2.1 K/uL (ref 0.7–4.0)
MCHC: 32 g/dL (ref 30.0–36.0)
MCV: 81 fl (ref 78.0–100.0)
Monocytes Absolute: 0.4 K/uL (ref 0.1–1.0)
Monocytes Relative: 8.3 % (ref 3.0–12.0)
Neutro Abs: 2.6 K/uL (ref 1.4–7.7)
Neutrophils Relative %: 48.8 % (ref 43.0–77.0)
Platelets: 301 K/uL (ref 150.0–400.0)
RBC: 4.67 Mil/uL (ref 3.87–5.11)
RDW: 13.4 % (ref 11.5–15.5)
WBC: 5.3 K/uL (ref 4.0–10.5)

## 2024-10-26 LAB — COMPREHENSIVE METABOLIC PANEL WITH GFR
ALT: 31 U/L (ref 3–35)
AST: 36 U/L (ref 5–37)
Albumin: 4.5 g/dL (ref 3.5–5.2)
Alkaline Phosphatase: 57 U/L (ref 39–117)
BUN: 21 mg/dL (ref 6–23)
CO2: 31 meq/L (ref 19–32)
Calcium: 9.2 mg/dL (ref 8.4–10.5)
Chloride: 104 meq/L (ref 96–112)
Creatinine, Ser: 0.61 mg/dL (ref 0.40–1.20)
GFR: 107.52 mL/min
Glucose, Bld: 95 mg/dL (ref 70–99)
Potassium: 3.7 meq/L (ref 3.5–5.1)
Sodium: 140 meq/L (ref 135–145)
Total Bilirubin: 0.2 mg/dL (ref 0.2–1.2)
Total Protein: 7.5 g/dL (ref 6.0–8.3)

## 2024-10-26 LAB — VITAMIN B12: Vitamin B-12: 449 pg/mL (ref 211–911)

## 2024-10-26 LAB — LIPID PANEL
Cholesterol: 180 mg/dL (ref 28–200)
HDL: 72.3 mg/dL
LDL Cholesterol: 96 mg/dL (ref 10–99)
NonHDL: 107.68
Total CHOL/HDL Ratio: 2
Triglycerides: 58 mg/dL (ref 10.0–149.0)
VLDL: 11.6 mg/dL (ref 0.0–40.0)

## 2024-10-26 LAB — TSH: TSH: 1.11 u[IU]/mL (ref 0.35–5.50)

## 2024-10-26 LAB — VITAMIN D 25 HYDROXY (VIT D DEFICIENCY, FRACTURES): VITD: 24.46 ng/mL — ABNORMAL LOW (ref 30.00–100.00)

## 2024-10-26 LAB — HEMOGLOBIN A1C: Hgb A1c MFr Bld: 6.1 % (ref 4.6–6.5)

## 2024-10-26 NOTE — Progress Notes (Signed)
 "    Established Patient Office Visit     CC/Reason for Visit: Annual preventive exam  HPI: Breanna Jenkins is a 44 y.o. female who is coming in today for the above mentioned reasons. Past Medical History is significant for: Vitamin D  deficiency and impaired glucose tolerance.  Feeling well without acute concerns or complaints.  Has routine dental care, is overdue for an eye exam.  Is requesting flu vaccine.  [facility].   Past Medical/Surgical History: Past Medical History:  Diagnosis Date   Ovarian tumor of borderline malignancy, right     Past Surgical History:  Procedure Laterality Date   CESAREAN SECTION  08/29/2011   Procedure: CESAREAN SECTION;  Surgeon: Rosaline LITTIE Cobble, MD;  Location: [facility];  Service: Gynecology;  Laterality: N/A;   LAPAROTOMY Right 06/17/2018   Laparotomy with Right Salpingo ophorectomy;  Surgeon: Dannielle Bouchard, DO;  Location: [facility];  Service: Gynecology;  Laterality: Right;  removal of right adnexal mass with possible BSO   LASIK     SALPINGOOPHORECTOMY Left 2009   teratoma per pathology report   SALPINGOOPHORECTOMY Right 06/17/2018   with laparotomy above 05/2018    Social History:  reports that she has never smoked. She has never used smokeless tobacco. She reports that she does not drink alcohol and does not use drugs.  Allergies: Allergies[1]  Family History:  Family History  Adopted: Yes  Problem Relation Age of Onset   Colon cancer Neg Hx    Esophageal cancer Neg Hx    Pancreatic cancer Neg Hx    Diabetes Neg Hx     Current Medications[2]  Review of Systems:  Negative unless indicated in HPI.   Physical Exam: Vitals:   10/26/24 0709  BP: 110/64  Pulse: 64  Temp: 98 F (36.7 C)  TempSrc: Oral  SpO2: 99%  Weight: 121 lb 6.4 oz (55.1 kg)  Height: 5' 2 (1.575 m)    Body mass index is 22.2 kg/m.   Physical Exam Vitals reviewed.  Constitutional:      General: She is not in acute  distress.    Appearance: Normal appearance. She is not ill-appearing, toxic-appearing or diaphoretic.  HENT:     Head: Normocephalic.     Right Ear: Tympanic membrane, ear canal and external ear normal. There is no impacted cerumen.     Left Ear: Tympanic membrane, ear canal and external ear normal. There is no impacted cerumen.     Nose: Nose normal.     Mouth/Throat:     Mouth: Mucous membranes are moist.     Pharynx: Oropharynx is clear. No oropharyngeal exudate or posterior oropharyngeal erythema.  Eyes:     General: No scleral icterus.       Right eye: No discharge.        Left eye: No discharge.     Conjunctiva/sclera: Conjunctivae normal.     Pupils: Pupils are equal, round, and reactive to light.  Neck:     Vascular: No carotid bruit.  Cardiovascular:     Rate and Rhythm: Normal rate and regular rhythm.     Pulses: Normal pulses.     Heart sounds: Normal heart sounds.  Pulmonary:     Effort: Pulmonary effort is normal. No respiratory distress.     Breath sounds: Normal breath sounds.  Abdominal:     General: Abdomen is flat. Bowel sounds are normal.     Palpations: Abdomen is soft.  Musculoskeletal:  General: Normal range of motion.     Cervical back: Normal range of motion.  Skin:    General: Skin is warm and dry.  Neurological:     General: No focal deficit present.     Mental Status: She is alert and oriented to person, place, and time. Mental status is at baseline.  Psychiatric:        Mood and Affect: Mood normal.        Behavior: Behavior normal.        Thought Content: Thought content normal.        Judgment: Judgment normal.        Impression and Plan:  Encounter for preventive health examination -     CBC with Differential/Platelet; Future -     Comprehensive metabolic panel with GFR; Future -     Lipid panel; Future -     TSH; Future -     Vitamin B12; Future  Vitamin D  deficiency -     VITAMIN D  25 Hydroxy (Vit-D Deficiency, Fractures);  Future  IGT (impaired glucose tolerance) -     Hemoglobin A1c; Future  Immunization due    -Recommend routine eye and dental care. -Healthy lifestyle discussed in detail. -Labs to be updated today. -Prostate cancer screening: N/A Health Maintenance  Topic Date Due   Hepatitis B Vaccine (1 of 3 - 19+ 3-dose series) Never done   COVID-19 Vaccine (1 - 2025-26 season) Never done   Flu Shot  01/18/2025*   Breast Cancer Screening  03/12/2025   Pap with HPV screening  03/18/2028   DTaP/Tdap/Td vaccine (2 - Td or Tdap) 08/01/2031   Colon Cancer Screening  12/07/2033   Hepatitis C Screening  Completed   HIV Screening  Completed   Pneumococcal Vaccine  Aged Out   HPV Vaccine  Aged Out   Meningitis B Vaccine  Aged Out  *Topic was postponed. The date shown is not the original due date.   - Flu vaccine administered in office today.    Tully Theophilus Andrews, MD [facility]     [1]  Allergies Allergen Reactions   Lubricants Itching  [2] No current outpatient medications on file.  "

## 2024-10-26 NOTE — Addendum Note (Signed)
 Addended by: KATHRYNE MILLMAN B on: 10/26/2024 07:30 AM   Modules accepted: Orders

## 2024-10-27 ENCOUNTER — Ambulatory Visit: Payer: Self-pay | Admitting: Internal Medicine

## 2024-10-27 DIAGNOSIS — E559 Vitamin D deficiency, unspecified: Secondary | ICD-10-CM

## 2024-10-27 MED ORDER — VITAMIN D (ERGOCALCIFEROL) 1.25 MG (50000 UNIT) PO CAPS: 50000.0000 [IU] | ORAL_CAPSULE | ORAL | 0 refills | Status: AC
